# Patient Record
Sex: Male | Born: 1950 | Race: Black or African American | Hispanic: No | Marital: Married | Smoking: Former smoker
Health system: Southern US, Community
[De-identification: ages and names within clinical notes are randomized; demographics above are authoritative.]

## PROBLEM LIST (undated history)

## (undated) DIAGNOSIS — M549 Dorsalgia, unspecified: Secondary | ICD-10-CM

## (undated) DIAGNOSIS — H4940 Progressive external ophthalmoplegia, unspecified eye: Secondary | ICD-10-CM

## (undated) DIAGNOSIS — E7800 Pure hypercholesterolemia, unspecified: Secondary | ICD-10-CM

## (undated) DIAGNOSIS — I10 Essential (primary) hypertension: Secondary | ICD-10-CM

## (undated) DIAGNOSIS — M1990 Unspecified osteoarthritis, unspecified site: Secondary | ICD-10-CM

## (undated) DIAGNOSIS — H499 Unspecified paralytic strabismus: Secondary | ICD-10-CM

## (undated) DIAGNOSIS — F419 Anxiety disorder, unspecified: Secondary | ICD-10-CM

## (undated) DIAGNOSIS — H409 Unspecified glaucoma: Secondary | ICD-10-CM

## (undated) HISTORY — DX: Progressive external ophthalmoplegia, unspecified eye: H49.40

## (undated) HISTORY — DX: Dorsalgia, unspecified: M54.9

## (undated) HISTORY — DX: Unspecified paralytic strabismus: H49.9

## (undated) HISTORY — DX: Pure hypercholesterolemia, unspecified: E78.00

---

## 1982-09-25 HISTORY — PX: KNEE ARTHROSCOPY: SUR90

## 2001-12-30 ENCOUNTER — Ambulatory Visit (HOSPITAL_COMMUNITY): Admission: RE | Admit: 2001-12-30 | Discharge: 2001-12-30 | Payer: Self-pay | Admitting: Gastroenterology

## 2006-03-06 ENCOUNTER — Ambulatory Visit (HOSPITAL_COMMUNITY)
Admission: RE | Admit: 2006-03-06 | Discharge: 2006-03-06 | Payer: Self-pay | Admitting: Physical Medicine and Rehabilitation

## 2006-10-12 ENCOUNTER — Encounter: Admission: RE | Admit: 2006-10-12 | Discharge: 2006-10-12 | Payer: Self-pay | Admitting: Family Medicine

## 2006-10-14 ENCOUNTER — Encounter: Admission: RE | Admit: 2006-10-14 | Discharge: 2006-10-14 | Payer: Self-pay | Admitting: Family Medicine

## 2008-05-11 ENCOUNTER — Encounter: Admission: RE | Admit: 2008-05-11 | Discharge: 2008-05-11 | Payer: Self-pay | Admitting: Family Medicine

## 2010-10-16 ENCOUNTER — Encounter: Payer: Self-pay | Admitting: Family Medicine

## 2010-12-14 ENCOUNTER — Ambulatory Visit: Payer: Self-pay | Admitting: Sports Medicine

## 2011-02-10 NOTE — Procedures (Signed)
[facility]  Patient:    Todd [name], Todd [name] Visit Number: [id number] MRN: [id number]          Service Type: END Location: ENDO Attending Physician:  Dennison Bulla Ii Dictated by:   Verlin Grills, M.D. Proc. Date: 12/30/01 Admit Date:  12/30/2001   CC:         Desma Maxim, M.D.   Procedure Report  PROCEDURE:  Esophagogastroduodenoscopy and screening colonoscopy.  REFERRING PHYSICIAN:  Desma Maxim, M.D.  PROCEDURE INDICATION:  Mr. Todd [name] is a 60 year old male born _______ . For approximately a year, Mr. Todd [name] has had discomfort in his neck which does not interfere with his swallowing he does not experience odynophagia.  Mr. Todd [name] also has intermittent discomfort in his left upper quadrant of the abdomen. He denies dysphagia, odynophagia, gastrointestinal bleeding.  He was empirically placed on a proton pump inhibitor without improvement in his throat discomfort.  I discussed with Mr. Todd [name] the complications associated with Esophagogastroduodenoscopy and screening colonoscopy including intestinal bleeding and intestinal perforation. Mr. Todd [name] has signed the operative permit.  MEDICATION ALLERGIES:  None.  PAST MEDICAL HISTORY:  Glaucoma.  CHRONIC MEDICATIONS:  Timpoptic eye drops, Allegra.  PROCEDURE:  Esophagogastroduodenoscopy.  DESCRIPTION OF PROCEDURE:  After obtaining informed consent, Mr. Todd [name] was placed in the left lateral decubitus position. I administered intravenous Demerol and intravenous Versed to achieve conscious sedation for the procedure. The patients blood pressure and oxygen saturation and cardiac rhythm were monitored throughout the procedure and documented in the medical record. Todd [name] received 7.5 mg Versed and 50 mg Demerol for his endoscopic procedures. The Olympus gastroscope was passed through the posterior hypopharynx into the proximal esophagus  without difficulty. The hypopharynx, larynx, and vocal cords appeared normal.  Esophagoscopy:  The proximal, mid, and lower segments of the esophagus appeared completely normal.  Gastroscopy:  During the procedure, the up-down control on the gastroscope snapped. I was unable to get an adequate retroflex view of the gastric cardia and fundus. The gastric body, antrum, and pylorus appeared normal.  Duodenoscopy:  The duodenal bulb and descending duodenum appeared normal.  ASSESSMENT:  Normal esophagogastroduodenoscopy. I find no explanation to explain Todd [name] neck pain. I suspect an ear, nose, and throat evaluation is the next step.  PROCEDURE:  Screening colonoscopy.  DESCRIPTION OF PROCEDURE:  Anal inspection was normal. Digital rectal exam was normal. The Olympus Pediatric video colonoscope was introduced into the rectum and easily advanced to the cecum. Colonic preparation for the exam today was excellent.  Rectum:  Normal.  Sigmoid colon and descending colon:  Normal.  Splenic flexure:  Normal.  Transverse colon:  Normal.  Hepatic flexure:  Normal.  Ascending colon:  Normal.  Cecum and ileocecal valve:  Normal.  ASSESSMENT:  Normal screening proctocolonoscopy to the cecum. No endoscopic evidence for the presence of colorectal neoplasia or inflammatory bowel disease. There is no colonic disease to explain his intermittent left upper quadrant abdominal discomfort except for possibly intestinal spasm. If he has not had a CT scan of this area to rule out a retroperitoneal process, I would recommend doing so. Dictated by:   Verlin Grills, M.D. Attending Physician:  Dennison Bulla Ii DD:  12/30/01 TD:  12/30/01 Job: [id number]

## 2014-10-13 ENCOUNTER — Other Ambulatory Visit: Payer: Self-pay | Admitting: Family Medicine

## 2014-10-13 DIAGNOSIS — R29898 Other symptoms and signs involving the musculoskeletal system: Secondary | ICD-10-CM

## 2014-10-13 DIAGNOSIS — M5432 Sciatica, left side: Secondary | ICD-10-CM

## 2014-10-14 ENCOUNTER — Ambulatory Visit
Admission: RE | Admit: 2014-10-14 | Discharge: 2014-10-14 | Disposition: A | Discharge status: Home / Self Care | Payer: BLUE CROSS/BLUE SHIELD | Source: Ambulatory Visit | Attending: Family Medicine | Admitting: Family Medicine

## 2014-10-14 DIAGNOSIS — M5432 Sciatica, left side: Secondary | ICD-10-CM

## 2014-10-14 DIAGNOSIS — R29898 Other symptoms and signs involving the musculoskeletal system: Secondary | ICD-10-CM

## 2014-12-29 ENCOUNTER — Emergency Department (HOSPITAL_COMMUNITY)
Admission: EM | Admit: 2014-12-29 | Discharge: 2014-12-29 | Disposition: A | Discharge status: Home / Self Care | Payer: BLUE CROSS/BLUE SHIELD | Attending: Emergency Medicine | Admitting: Emergency Medicine

## 2014-12-29 ENCOUNTER — Encounter (HOSPITAL_COMMUNITY): Payer: Self-pay

## 2014-12-29 ENCOUNTER — Emergency Department (HOSPITAL_COMMUNITY): Payer: BLUE CROSS/BLUE SHIELD

## 2014-12-29 DIAGNOSIS — M1990 Unspecified osteoarthritis, unspecified site: Secondary | ICD-10-CM | POA: Diagnosis not present

## 2014-12-29 DIAGNOSIS — Z7982 Long term (current) use of aspirin: Secondary | ICD-10-CM | POA: Insufficient documentation

## 2014-12-29 DIAGNOSIS — H532 Diplopia: Secondary | ICD-10-CM | POA: Diagnosis present

## 2014-12-29 DIAGNOSIS — E780 Pure hypercholesterolemia: Secondary | ICD-10-CM | POA: Diagnosis not present

## 2014-12-29 DIAGNOSIS — H409 Unspecified glaucoma: Secondary | ICD-10-CM | POA: Diagnosis not present

## 2014-12-29 DIAGNOSIS — G4489 Other headache syndrome: Secondary | ICD-10-CM

## 2014-12-29 DIAGNOSIS — Z87891 Personal history of nicotine dependence: Secondary | ICD-10-CM | POA: Insufficient documentation

## 2014-12-29 DIAGNOSIS — F419 Anxiety disorder, unspecified: Secondary | ICD-10-CM | POA: Insufficient documentation

## 2014-12-29 DIAGNOSIS — J209 Acute bronchitis, unspecified: Secondary | ICD-10-CM | POA: Diagnosis not present

## 2014-12-29 DIAGNOSIS — I10 Essential (primary) hypertension: Secondary | ICD-10-CM | POA: Insufficient documentation

## 2014-12-29 DIAGNOSIS — Z79899 Other long term (current) drug therapy: Secondary | ICD-10-CM | POA: Diagnosis not present

## 2014-12-29 HISTORY — DX: Essential (primary) hypertension: I10

## 2014-12-29 HISTORY — DX: Anxiety disorder, unspecified: F41.9

## 2014-12-29 HISTORY — DX: Pure hypercholesterolemia, unspecified: E78.00

## 2014-12-29 HISTORY — DX: Unspecified osteoarthritis, unspecified site: M19.90

## 2014-12-29 HISTORY — DX: Unspecified glaucoma: H40.9

## 2014-12-29 LAB — COMPREHENSIVE METABOLIC PANEL
ALBUMIN: 3.6 g/dL (ref 3.5–5.2)
ALT: 22 U/L (ref 0–53)
AST: 23 U/L (ref 0–37)
Alkaline Phosphatase: 38 U/L — ABNORMAL LOW (ref 39–117)
Anion gap: 6 (ref 5–15)
BUN: 8 mg/dL (ref 6–23)
CALCIUM: 9.1 mg/dL (ref 8.4–10.5)
CHLORIDE: 106 mmol/L (ref 96–112)
CO2: 26 mmol/L (ref 19–32)
CREATININE: 1.06 mg/dL (ref 0.50–1.35)
GFR calc Af Amer: 84 mL/min — ABNORMAL LOW (ref >90)
GFR, EST NON AFRICAN AMERICAN: 72 mL/min — AB (ref >90)
Glucose, Bld: 102 mg/dL — ABNORMAL HIGH (ref 70–99)
Potassium: 3.9 mmol/L (ref 3.5–5.1)
SODIUM: 138 mmol/L (ref 135–145)
Total Bilirubin: 0.9 mg/dL (ref 0.3–1.2)
Total Protein: 6.6 g/dL (ref 6.0–8.3)

## 2014-12-29 LAB — CBC WITH DIFFERENTIAL/PLATELET
Basophils Absolute: 0.1 10*3/uL (ref 0.0–0.1)
Basophils Relative: 1 % (ref 0–1)
EOS ABS: 0.9 10*3/uL — AB (ref 0.0–0.7)
EOS PCT: 15 % — AB (ref 0–5)
HCT: 41.6 % (ref 39.0–52.0)
HEMOGLOBIN: 13.9 g/dL (ref 13.0–17.0)
LYMPHS ABS: 1.8 10*3/uL (ref 0.7–4.0)
Lymphocytes Relative: 29 % (ref 12–46)
MCH: 32 pg (ref 26.0–34.0)
MCHC: 33.4 g/dL (ref 30.0–36.0)
MCV: 95.6 fL (ref 78.0–100.0)
MONOS PCT: 7 % (ref 3–12)
Monocytes Absolute: 0.4 10*3/uL (ref 0.1–1.0)
Neutro Abs: 3 10*3/uL (ref 1.7–7.7)
Neutrophils Relative %: 48 % (ref 43–77)
PLATELETS: 202 10*3/uL (ref 150–400)
RBC: 4.35 MIL/uL (ref 4.22–5.81)
RDW: 12.7 % (ref 11.5–15.5)
WBC: 6.3 10*3/uL (ref 4.0–10.5)

## 2014-12-29 LAB — TSH: TSH: 0.347 u[IU]/mL — AB (ref 0.350–4.500)

## 2014-12-29 LAB — SEDIMENTATION RATE: Sed Rate: 23 mm/hr — ABNORMAL HIGH (ref 0–16)

## 2014-12-29 MED ORDER — BENZONATATE 100 MG PO CAPS: 100.0000 mg | ORAL_CAPSULE | Freq: Three times a day (TID) | ORAL | Status: DC

## 2014-12-29 MED ORDER — GADOBENATE DIMEGLUMINE 529 MG/ML IV SOLN
20.0000 mL | Freq: Once | INTRAVENOUS | Status: AC
  Administered 2014-12-29: 19 mL via INTRAVENOUS

## 2014-12-29 MED ORDER — PREDNISONE 20 MG PO TABS: ORAL_TABLET | ORAL | Status: DC

## 2014-12-29 MED ORDER — HYDROCODONE-HOMATROPINE 5-1.5 MG/5ML PO SYRP: 5.0000 mL | ORAL_SOLUTION | Freq: Four times a day (QID) | ORAL | Status: DC | PRN

## 2014-12-29 NOTE — ED Notes (Signed)
Pt given eye patch per MD request

## 2014-12-29 NOTE — ED Notes (Signed)
Pt comfortable with discharge and follow up instructions. Prescriptions x3.

## 2014-12-29 NOTE — ED Notes (Signed)
MD at bedside. 

## 2014-12-29 NOTE — ED Notes (Signed)
Onset 1-2 months headache, takes Tylenol or Ibuprofen with relief.  Onset 2 weeks double vision in right eye, looks to left and double vision goes away.  No other symptoms noted.

## 2014-12-29 NOTE — ED Provider Notes (Signed)
CSN: [id number]     Arrival date & time 12/29/14  1058 History   First MD Initiated Contact with Patient 12/29/14 1232     Chief Complaint  Patient presents with  . Headache  . Diplopia     (Consider location/radiation/quality/duration/timing/severity/associated sxs/prior Treatment) HPI The patient reports that he developed a headache about 3-4 weeks ago. It is predominantly on the left side of his head. It has an aching quality. He reports that as of about 2 weeks ago he started to develop double vision. He has not had any fevers, chills or neck stiffness. There's been no nausea or vomiting. There has been no weakness numbness or tingling of the extremities. There is been no gait incoordination. The double vision is made worse by looking to the right. It changes as he moves his eyes. The patient was seen at the ophthalmologist's office today and referred to the emergency department for further evaluation. Reviewing their note, there were concerns for temporal arteritis in suggestion for neuro imaging and neurology consultation. Past Medical History  Diagnosis Date  . Hypertension   . Arthritis   . Anxiety   . Elevated cholesterol   . Glaucoma     right worse than left   Past Surgical History  Procedure Laterality Date  . Knee arthroscopy Left    History reviewed. No pertinent family history. History  Substance Use Topics  . Smoking status: Former Research scientist (life sciences)  . Smokeless tobacco: Not on file  . Alcohol Use: Yes     Comment: once month    Review of Systems  10 Systems reviewed and are negative for acute change except as noted in the HPI. Patient notes that he has had some minor cold symptoms with cough. He denies she's had any chest pain, sputum production or fever. He states he had an appointment with his primary care doctor today to address this, he however had to cancel that due to being sent to the emergency department for further evaluation of his headache and ocular  dysfunction.  Allergies  Alphagan p  Home Medications   Prior to Admission medications   Medication Sig Start Date End Date Taking? Authorizing Provider  aspirin 81 MG tablet Take 81 mg by mouth daily.   Yes Historical Provider, MD  atenolol (TENORMIN) 25 MG tablet Take 25 mg by mouth daily.   Yes Historical Provider, MD  bimatoprost (LUMIGAN) 0.03 % ophthalmic solution Place 1 drop into both eyes at bedtime.   Yes Historical Provider, MD  co-enzyme Q-10 30 MG capsule Take 30 mg by mouth daily as needed (with joint pain).   Yes Historical Provider, MD  ezetimibe (ZETIA) 10 MG tablet Take 10 mg by mouth every evening.   Yes Historical Provider, MD  ibuprofen (ADVIL,MOTRIN) 200 MG tablet Take 400 mg by mouth every 6 (six) hours as needed for headache.   Yes Historical Provider, MD  Multiple Vitamin (MULTIVITAMIN WITH MINERALS) TABS tablet Take 1 tablet by mouth daily.   Yes Historical Provider, MD  Omega-3 Fatty Acids (FISH OIL) 1200 MG CAPS Take 1 capsule by mouth daily.   Yes Historical Provider, MD  benzonatate (TESSALON) 100 MG capsule Take 1 capsule (100 mg total) by mouth every 8 (eight) hours. 12/29/14   Charlesetta Shanks, MD  dorzolamide-timolol (COSOPT) 22.3-6.8 MG/ML ophthalmic solution Place 1 drop into both eyes 2 (two) times daily.    Historical Provider, MD  HYDROcodone-homatropine (HYCODAN) 5-1.5 MG/5ML syrup Take 5 mLs by mouth every 6 (six) hours as  needed for cough. 12/29/14   Charlesetta Shanks, MD  predniSONE (DELTASONE) 20 MG tablet 3 tabs po day one, then 2 tabs daily x 4 days 12/29/14   Charlesetta Shanks, MD   BP 149/76 mmHg  Pulse 58  Temp(Src) 97.8 F (36.6 C) (Oral)  Resp 16  Ht 5\' 11"  (1.803 m)  Wt 190 lb 14.4 oz (86.592 kg)  BMI 26.64 kg/m2  SpO2 98% Physical Exam  Constitutional: He is oriented to person, place, and time. He appears well-developed and well-nourished.  HENT:  Head: Normocephalic and atraumatic.  Eyes: Pupils are equal, round, and reactive to light.   Patient has asymmetric gaze with lack of lateral movement of the left eye for leftward gaze.  Neck: Neck supple.  Cardiovascular: Normal rate, regular rhythm, normal heart sounds and intact distal pulses.   Pulmonary/Chest: Effort normal and breath sounds normal.  Abdominal: Soft. Bowel sounds are normal. He exhibits no distension. There is no tenderness.  Musculoskeletal: Normal range of motion. He exhibits no edema.  Neurological: He is alert and oriented to person, place, and time. He has normal strength. A cranial nerve deficit is present. He exhibits normal muscle tone. Coordination normal. GCS eye subscore is 4. GCS verbal subscore is 5. GCS motor subscore is 6.  Facial cranial nerves are otherwise intact. Except I motions as noted. Normal finger-nose examination bilaterally normal heel shin examination.  Skin: Skin is warm, dry and intact.  Psychiatric: He has a normal mood and affect.    ED Course  Procedures (including critical care time) Labs Review Labs Reviewed  COMPREHENSIVE METABOLIC PANEL - Abnormal; Notable for the following:    Glucose, Bld 102 (*)    Alkaline Phosphatase 38 (*)    GFR calc non Af Amer 72 (*)    GFR calc Af Amer 84 (*)    All other components within normal limits  CBC WITH DIFFERENTIAL/PLATELET - Abnormal; Notable for the following:    Eosinophils Relative 15 (*)    Eosinophils Absolute 0.9 (*)    All other components within normal limits  SEDIMENTATION RATE - Abnormal; Notable for the following:    Sed Rate 23 (*)    All other components within normal limits  TSH - Abnormal; Notable for the following:    TSH 0.347 (*)    All other components within normal limits  C-REACTIVE PROTEIN  T4, FREE    Imaging Review Mr Kizzie Fantasia Contrast  12/29/2014   CLINICAL DATA:  Headache. Intermittent diplopia over the last 22 months.  EXAM: MRI HEAD AND ORBITS WITHOUT AND WITH CONTRAST  TECHNIQUE: Multiplanar, multiecho pulse sequences of the brain and  surrounding structures were obtained without and with intravenous contrast. Multiplanar, multiecho pulse sequences of the orbits and surrounding structures were obtained including fat saturation techniques, before and after intravenous contrast administration.  CONTRAST:  11mL MULTIHANCE GADOBENATE DIMEGLUMINE 529 MG/ML IV SOLN  COMPARISON:  None.  FINDINGS: MRI HEAD FINDINGS  Mild periventricular and subcortical T2 changes are slightly greater than expected for age. No acute infarct, hemorrhage, or mass lesion is present. Insert pass ventricles insert pass fluid  Flow is present in the major intracranial arteries. Mild mucosal thickening is present in the right maxillary sinus and anterior right ethmoid air cells. Mild mucosal thickening is present along the inferior aspect of the right frontal sinus. The remaining paranasal sinuses and the mastoid air cells are clear. The skullbase is within normal limits. Midline structures are normal.  MRI ORBITS FINDINGS  There is mild atrophy of the right optic nerve. No nerve signal abnormality or enhancement is evident. The optic chiasm is within normal limits. The left optic nerve is normal.  The orbital musculature is within normal is by laterally. The lacrimal gland is normal. The globes are unremarkable. The lens is located bilaterally.  IMPRESSION: 1. 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 2. No other focal lesion of the orbits to explain diplopia. 3. Mild periventricular and subcortical T2 changes are slightly advanced for age. The finding is nonspecific but can be seen in the setting of chronic microvascular ischemia, a demyelinating process such as multiple sclerosis, vasculitis, complicated migraine headaches, or as the sequelae of a prior infectious or inflammatory process. 4. No focal pathology within the occipital cortex.   Electronically Signed   By: San Morelle M.D.   On: 12/29/2014 15:45   Mr  Darnelle Catalan Wo/w Cm  12/29/2014   CLINICAL DATA:  Headache. Intermittent diplopia over the last 22 months.  EXAM: MRI HEAD AND ORBITS WITHOUT AND WITH CONTRAST  TECHNIQUE: Multiplanar, multiecho pulse sequences of the brain and surrounding structures were obtained without and with intravenous contrast. Multiplanar, multiecho pulse sequences of the orbits and surrounding structures were obtained including fat saturation techniques, before and after intravenous contrast administration.  CONTRAST:  50mL MULTIHANCE GADOBENATE DIMEGLUMINE 529 MG/ML IV SOLN  COMPARISON:  None.  FINDINGS: MRI HEAD FINDINGS  Mild periventricular and subcortical T2 changes are slightly greater than expected for age. No acute infarct, hemorrhage, or mass lesion is present. Insert pass ventricles insert pass fluid  Flow is present in the major intracranial arteries. Mild mucosal thickening is present in the right maxillary sinus and anterior right ethmoid air cells. Mild mucosal thickening is present along the inferior aspect of the right frontal sinus. The remaining paranasal sinuses and the mastoid air cells are clear. The skullbase is within normal limits. Midline structures are normal.  MRI ORBITS FINDINGS  There is mild atrophy of the right optic nerve. No nerve signal abnormality or enhancement is evident. The optic chiasm is within normal limits. The left optic nerve is normal.  The orbital musculature is within normal is by laterally. The lacrimal gland is normal. The globes are unremarkable. The lens is located bilaterally.  IMPRESSION: 1. 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 2. No other focal lesion of the orbits to explain diplopia. 3. Mild periventricular and subcortical T2 changes are slightly advanced for age. The finding is nonspecific but can be seen in the setting of chronic microvascular ischemia, a demyelinating process such as multiple sclerosis, vasculitis, complicated  migraine headaches, or as the sequelae of a prior infectious or inflammatory process. 4. No focal pathology within the occipital cortex.   Electronically Signed   By: San Morelle M.D.   On: 12/29/2014 15:45     EKG Interpretation None     Consultation: Case was reviewed with Dr. Leonel Ramsay. He has reviewed the MR results and diagnostic studies. We have discussed the patient's physical examination history. 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 Based on current evaluation the patient is safe for discharge with outpatient neurology follow-up. MDM   Final diagnoses:  Diplopia  Other headache syndrome  Acute bronchitis, unspecified organism   The patient has been evaluated as outlined. At this point based on MR findings and neurology consultation patient be  discharged for continued outpatient follow-up. He has been seen by ophthalmology today. He has continued treatment scheduled with ophthalmology as well. The patient incidentally missed a appointment with his family care provider for cough symptoms. At this point does seem most consistent with a minor acute bronchitis. The patient is well in appearance with no wheezing or dyspnea or chest pain associated. He requested treatment for bronchitis as he had missed that appointment today. Based on this is been treated for acute bronchitis with cough medications.    Charlesetta Shanks, MD 12/29/14 [phone]

## 2014-12-29 NOTE — Discharge Instructions (Signed)
Diplopia  °Double vision (diplopia) means that you are seeing two of everything. Diplopia usually occurs with both eyes open, and may be worse when looking in one particular direction. If both eyes must be open to see double, this is called binocular diplopia. If double images are seen in just one eye, this is called monocular diplopia.  °CAUSES  °Binocular Diplopia °· Disorder affecting the muscles that move the eyes or the nerves that control those muscles. °· Tumor or other mass pushing on an eye from beside or behind the eye. °· Myasthenia gravis. This is a neuromuscular illness that causes the body's muscles to tire easily. The eye muscles and eyelid muscles become weak. The eyes do not track together well. °· Grave's disease. This is an overactivity of the thyroid gland. This condition causes swelling of tissues around the eyes. This produces a bulging out of the eyeball. °· Blowout fracture of the bone around the eye. The muscles of the eye socket are damaged. This often happens when the eye is hit with force. °· Complications after certain surgeries, such as a lens implant after cataract surgery. °· Fluid-filled mass (abscess) behind or beside the eye, infection, or abnormal connection between arteries and veins. °Sometimes, no cause is found. °Monocular Diplopia °· Problems with corrective lens or contacts. °· Corneal abrasion, infection, severe injury, or bulging and irregularity of the corneal surface (keratoconus). °· Irregularities of the pupil from drugs, severe injury, or other causes. °· Problems involving the lens of the eye, such as opacities or cataracts. °· Complications after certain surgeries, such as a lens implant after cataract surgery. °· Retinal detachment or problems involving the blood vessels of the retina. °Sometimes, no cause is found. °SYMPTOMS  °Binocular Diplopia °· When one eye is closed or covered, the double images disappear. °Monocular Diplopia °· When the unaffected eye is  closed or covered, the double images remain. The double images disappear when the affected eye is closed or covered. °DIAGNOSIS  °A diagnosis is made during an eye exam. °TREATMENT  °Treatment depends on the cause or underlying disease.  °· Relief of double vision symptoms may be achieved by patching one eye or by using special glasses. °· Surgery on the muscles of the eye may be needed. °SEEK IMMEDIATE MEDICAL CARE IF:  °· You see two images of a single object you are looking at, either with both eyes open or with just one eye open. °Document Released: 07/13/2004 Document Revised: 12/04/2011 Document Reviewed: 04/29/2008 °[employer]® Patient Information ©2015 [employer]. This information is not intended to replace advice given to you by your health care provider. Make sure you discuss any questions you have with your health care provider. ° °

## 2014-12-30 LAB — C-REACTIVE PROTEIN: CRP: <0.5 mg/dL — ABNORMAL LOW (ref <0.60)

## 2014-12-30 LAB — T4, FREE: FREE T4: 1.08 ng/dL (ref 0.80–1.80)

## 2015-01-04 ENCOUNTER — Ambulatory Visit (INDEPENDENT_AMBULATORY_CARE_PROVIDER_SITE_OTHER): Payer: BLUE CROSS/BLUE SHIELD | Admitting: Neurology

## 2015-01-04 ENCOUNTER — Encounter: Payer: Self-pay | Admitting: Neurology

## 2015-01-04 VITALS — BP 128/83 | HR 81 | Temp 97.9°F | Ht 71.0 in | Wt 194.5 lb

## 2015-01-04 DIAGNOSIS — G4459 Other complicated headache syndrome: Secondary | ICD-10-CM

## 2015-01-04 DIAGNOSIS — I776 Arteritis, unspecified: Secondary | ICD-10-CM | POA: Diagnosis not present

## 2015-01-04 DIAGNOSIS — H4942 Progressive external ophthalmoplegia, left eye: Secondary | ICD-10-CM | POA: Diagnosis not present

## 2015-01-04 DIAGNOSIS — G08 Intracranial and intraspinal phlebitis and thrombophlebitis: Secondary | ICD-10-CM

## 2015-01-04 DIAGNOSIS — H532 Diplopia: Secondary | ICD-10-CM

## 2015-01-04 DIAGNOSIS — H4922 Sixth [abducent] nerve palsy, left eye: Secondary | ICD-10-CM

## 2015-01-04 DIAGNOSIS — H4720 Unspecified optic atrophy: Secondary | ICD-10-CM | POA: Diagnosis not present

## 2015-01-04 NOTE — Patient Instructions (Signed)
Overall you are doing fairly well but I do want to suggest a few things today:   Remember to drink plenty of fluid, eat healthy meals and do not skip any meals. Try to eat protein with a every meal and eat a healthy snack such as fruit or nuts in between meals. Try to keep a regular sleep-wake schedule and try to exercise daily, particularly in the form of walking, 20-30 minutes a day, if you can.   As far as your medications are concerned, I would like to suggest: hold off  As far as diagnostic testing: imaging, lumbar puncture, labwork  I would like to see you back in 1 month, sooner if we need to. Please call us with any interim questions, concerns, problems, updates or refill requests.   Please also call us for any test results so we can go over those with you on the phone.  My clinical assistant and will answer any of your questions and relay your messages to me and also relay most of my messages to you.   Our phone number is [phone]. We also have an after hours call service for urgent matters and there is a physician on-call for urgent questions. For any emergencies you know to call 911 or go to the nearest emergency room

## 2015-01-04 NOTE — Progress Notes (Addendum)
[facility]    Provider:  Dr Jaynee Eagles Referring Provider: No ref. provider found Primary Care Physician:  Mayra Neer, MD  CC:  diplopia  HPI:  Todd [name] is a 64 y.o. male here as a referral from the hospital for diplopia. He is having pain on the left side of the head. Pain on eye movement. Headache, cramping to the middle of the head. No light sensitivity, no sound sensitivity, no nausea, no vomiting. Double vision in the left eye for 2 weeks, continuously all day. 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 Has had vision problems for years due to the glaucoma. But the double vision only for the last 2 weeks. Only when both eyes are open. Images Side by side. Worse when looking far away. Worse when looks to the left . It is getting worse. Headache is getting worse. Headache for 2 months. Headache worse during the day. Better at night. Ibuprofen helps. Is severe.  Left eye movement is painful, severe pain. No inciting factors.   Reviewed notes, labs and imaging from outside physicians, which showed:  MR brain and Orbits: (personally reviewed images)  IMPRESSION: 1. 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 2. No other focal lesion of the orbits to explain diplopia. 3. Mild periventricular and subcortical T2 changes are slightly advanced for age. The finding is nonspecific but can be seen in the setting of chronic microvascular ischemia, a demyelinating process such as multiple sclerosis, vasculitis, complicated migraine headaches, or as the sequelae of a prior infectious or inflammatory process. 4. No focal pathology within the occipital cortex.  Review of Systems: Patient complains of symptoms per HPI as well as the following symptoms double vision, blurry vision, glaucome, No CP, no SOB. Pertinent negatives per HPI. All others negative.   History   Social History  . Marital Status: Married    Spouse  Name: N/A  . Number of Children: 4  . Years of Education: Bachelor's   Occupational History  . [place] History Main Topics  . Smoking status: Former Smoker -- 10 years    Types: Cigarettes    Quit date: 09/25/1976  . Smokeless tobacco: Not on file  . Alcohol Use: 0.0 oz/week    0 Standard drinks or equivalent per week     Comment: Once per month  . Drug Use: No  . Sexual Activity: Not on file   Other Topics Concern  . Not on file   Social History Narrative   Lives at home with wife.   Right handed.   Caffeine use: 1 cup coffee per day.   Occasionally drinks soda and/or tea     Family History  Problem Relation Age of Onset  . Glaucoma Mother   . Glaucoma Father   . Pancreatic cancer Mother     Past Medical History  Diagnosis Date  . Hypertension   . Arthritis   . Anxiety   . Elevated cholesterol   . Glaucoma     right worse than left  . High cholesterol   . Back pain     Fragments in vertebrae     Past Surgical History  Procedure Laterality Date  . Knee arthroscopy Left 1984    Current Outpatient Prescriptions  Medication Sig Dispense Refill  . aspirin 81 MG tablet Take 81 mg by mouth daily.    Marland Kitchen atenolol (TENORMIN) 25 MG tablet Take 25 mg by mouth daily.    Marland Kitchen  benzonatate (TESSALON) 100 MG capsule Take 1 capsule (100 mg total) by mouth every 8 (eight) hours. 21 capsule 0  . bimatoprost (LUMIGAN) 0.03 % ophthalmic solution Place 1 drop into both eyes at bedtime.    Marland Kitchen co-enzyme Q-10 30 MG capsule Take 30 mg by mouth daily as needed (with joint pain).    . dorzolamide-timolol (COSOPT) 22.3-6.8 MG/ML ophthalmic solution Place 1 drop into both eyes 2 (two) times daily.    Marland Kitchen ezetimibe (ZETIA) 10 MG tablet Take 10 mg by mouth every evening.    Marland Kitchen HYDROcodone-acetaminophen (NORCO/VICODIN) 5-325 MG per tablet Take 1 tablet by mouth as needed.  0  . HYDROcodone-homatropine (HYCODAN) 5-1.5 MG/5ML syrup Take 5 mLs by mouth every 6 (six) hours as  needed for cough. 120 mL 0  . ibuprofen (ADVIL,MOTRIN) 200 MG tablet Take 400 mg by mouth every 6 (six) hours as needed for headache.    . Multiple Vitamin (MULTIVITAMIN WITH MINERALS) TABS tablet Take 1 tablet by mouth daily.    . Omega-3 Fatty Acids (FISH OIL) 1200 MG CAPS Take 1 capsule by mouth daily.    . predniSONE (DELTASONE) 20 MG tablet 3 tabs po day one, then 2 tabs daily x 4 days (Patient not taking: Reported on 01/04/2015) 11 tablet 0   No current facility-administered medications for this visit.    Allergies as of 01/04/2015 - Review Complete 01/04/2015  Allergen Reaction Noted  . Alphagan p [brimonidine tartrate] Other (See Comments) 12/29/2014  . Lipitor [atorvastatin]  01/04/2015    Vitals: BP 128/83 mmHg  Pulse 81  Temp(Src) 97.9 F (36.6 C)  Ht 5\' 11"  (1.803 m)  Wt 194 lb 8 oz (88.225 kg)  BMI 27.14 kg/m2 Last Weight:  Wt Readings from Last 1 Encounters:  01/04/15 194 lb 8 oz (88.225 kg)   Last Height:   Ht Readings from Last 1 Encounters:  01/04/15 5\' 11"  (1.803 m)   Physical exam: Exam: Gen: NAD, conversant, well nourised              CV: RRR, no MRG. No Carotid Bruits. No peripheral edema, warm, nontender Eyes: Conjunctival injection  Neuro: Detailed Neurologic Exam  Speech:    Speech is normal; fluent and spontaneous with normal comprehension.  Cognition:    The patient is oriented to person, place, and time;     recent and remote memory intact;     language fluent;     normal attention, concentration,     fund of knowledge Cranial Nerves:    Right optic nerve pallor and atrophy, APD. Inability to abduct left eye past the midline. 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 Trigeminal sensation is intact and the muscles of mastication are normal. The face is symmetric. The palate elevates in the midline. Hearing intact. Voice is normal. Shoulder shrug is normal. The tongue has normal motion without fasciculations.    Coordination:    Normal finger to nose and heel to shin.   Gait:    Heel-toe and tandem gait are normal.   Motor Observation:    No asymmetry, no atrophy, and no involuntary movements noted. Tone:    Normal muscle tone.    Posture:    Posture is normal. normal erect    Strength:    Strength is V/V in the upper and lower limbs.      Sensation: intact to LT     Reflex Exam:  DTR's:    Deep tendon reflexes  in the upper and lower extremities are symmetric bilaterally.   Toes:    The toes are downgoing bilaterally.   Clonus:    Clonus is absent.     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 Only the 6th nerve appears to be involved but given the pain component, need to rule out intracavernous carotid artery aneurysm, posterior cerebral artery aneurysm, carotid cavernous fistula or carotid cavernous sinus thrombosis or other aneurysm or dissection, thrombosis of the cerebral veins and sinuses. Could be microvascular ischemic ocular motor cranial nerve palsy which is accompanied by pain. No recent trauma.    Will check MRV of the head. Can look at the cerebral veins and sinuses.  Will check labs.    Spoke to Dr. Darleen Crocker Ophthalmologist at [facility], right optic atrophy is chronic secondary to glaucoma  Could look at MRA, suppose could have an Intracavernous internal carotid artery aneurysm. Will hold off.  Sarina Ill, MD  [facility] [address] [place], [place] [address]  Phone [phone] Fax [phone]

## 2015-01-07 ENCOUNTER — Other Ambulatory Visit (INDEPENDENT_AMBULATORY_CARE_PROVIDER_SITE_OTHER): Payer: Self-pay

## 2015-01-07 DIAGNOSIS — H4720 Unspecified optic atrophy: Secondary | ICD-10-CM

## 2015-01-07 DIAGNOSIS — H532 Diplopia: Secondary | ICD-10-CM

## 2015-01-07 DIAGNOSIS — Z0289 Encounter for other administrative examinations: Secondary | ICD-10-CM

## 2015-01-07 DIAGNOSIS — H4942 Progressive external ophthalmoplegia, left eye: Secondary | ICD-10-CM

## 2015-01-07 DIAGNOSIS — I776 Arteritis, unspecified: Secondary | ICD-10-CM

## 2015-01-11 ENCOUNTER — Telehealth: Payer: Self-pay | Admitting: Neurology

## 2015-01-11 DIAGNOSIS — H4942 Progressive external ophthalmoplegia, left eye: Secondary | ICD-10-CM | POA: Insufficient documentation

## 2015-01-11 DIAGNOSIS — G08 Intracranial and intraspinal phlebitis and thrombophlebitis: Secondary | ICD-10-CM | POA: Insufficient documentation

## 2015-01-11 NOTE — Telephone Encounter (Signed)
Spoke to patient. Will try and get an MRA of his head and MRV of his head within th enext 2 days due to painful left ophthalmoplegia.

## 2015-01-11 NOTE — Telephone Encounter (Signed)
Spoke to patient. Eye not improving. Left eye is in a lot pain. Left temple is in pain. Labs are coming back normal. He has MRA of the head scheduled. Will order LP as well.

## 2015-01-12 ENCOUNTER — Ambulatory Visit
Admission: RE | Admit: 2015-01-12 | Discharge: 2015-01-12 | Disposition: A | Discharge status: Home / Self Care | Payer: BLUE CROSS/BLUE SHIELD | Source: Ambulatory Visit | Attending: Neurology | Admitting: Neurology

## 2015-01-12 DIAGNOSIS — H532 Diplopia: Secondary | ICD-10-CM

## 2015-01-12 DIAGNOSIS — G08 Intracranial and intraspinal phlebitis and thrombophlebitis: Secondary | ICD-10-CM

## 2015-01-12 DIAGNOSIS — H4920 Sixth [abducent] nerve palsy, unspecified eye: Secondary | ICD-10-CM | POA: Insufficient documentation

## 2015-01-12 DIAGNOSIS — R519 Headache, unspecified: Secondary | ICD-10-CM | POA: Insufficient documentation

## 2015-01-12 DIAGNOSIS — R51 Headache: Secondary | ICD-10-CM | POA: Diagnosis not present

## 2015-01-12 DIAGNOSIS — H4942 Progressive external ophthalmoplegia, left eye: Secondary | ICD-10-CM

## 2015-01-12 DIAGNOSIS — H4720 Unspecified optic atrophy: Secondary | ICD-10-CM | POA: Insufficient documentation

## 2015-01-13 ENCOUNTER — Other Ambulatory Visit: Payer: Self-pay | Admitting: Neurology

## 2015-01-13 MED ORDER — PREDNISONE 20 MG PO TABS: ORAL_TABLET | ORAL | Status: DC

## 2015-01-13 NOTE — Telephone Encounter (Signed)
Left message for patient to call back. Gave [facility] phone number.

## 2015-01-13 NOTE — Telephone Encounter (Signed)
Patient has a painful 6th nerve palsy. 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   Will start empiric oral prednisone 80mg  daily for three days then taper by 20mg  every 2 weeks. Will need to watch for side effects closely including glucose, blood pressure, mood.

## 2015-01-13 NOTE — Telephone Encounter (Signed)
Talked with patient about normal MRV results and patient would like to talk with Dr. Jaynee Eagles about next steps. I told him he has a follow up appt with Dr. Jaynee Eagles on May 11 at 2:30pm. He verbalized understanding and would like Dr. Jaynee Eagles to call him back.

## 2015-01-19 ENCOUNTER — Telehealth: Payer: Self-pay | Admitting: Neurology

## 2015-01-19 NOTE — Telephone Encounter (Signed)
Talked with patient and he is going to keep taking Prednisone (20mg ) 3 times per day per Dr. Jaynee Eagles instructions. He also has an appointment on Feb 03, 2015 at 2:30  pm. I told him to arrive at 2:15pm for his appt. Pt verbalized understanding.

## 2015-01-19 NOTE — Telephone Encounter (Signed)
Pt is calling stating he is still taking predniSONE (DELTASONE) 20 MG tablet and his headache is gone, but still having double vision.  Please call and advise.

## 2015-01-19 NOTE — Telephone Encounter (Signed)
Emma, That's good, please instruct him to keep taking it. Make sure he has a follow up with me within 2 weeks, 30 minutes. Make sure he is on the right dose, look at my script. thanks Thank you.

## 2015-01-19 NOTE — Telephone Encounter (Signed)
Thank you :)

## 2015-01-20 ENCOUNTER — Other Ambulatory Visit: Payer: BLUE CROSS/BLUE SHIELD

## 2015-01-22 LAB — CK: Total CK: 248 U/L — ABNORMAL HIGH (ref 24–204)

## 2015-01-22 LAB — ACETYLCHOLINE RECEPTOR, BINDING: AChR Binding Ab, Serum: 0.08 nmol/L (ref 0.00–0.24)

## 2015-01-22 LAB — NEUROMYELITIS OPTICA AUTOAB, IGG: NMO-IgG: <1.5 U/mL (ref 0.0–3.0)

## 2015-01-22 LAB — MULTIPLE MYELOMA PANEL, SERUM
ALBUMIN SERPL ELPH-MCNC: 4.1 g/dL (ref 3.2–5.6)
ALBUMIN/GLOB SERPL: 1.4 (ref 0.7–2.0)
ALPHA 1: 0.2 g/dL (ref 0.1–0.4)
Alpha2 Glob SerPl Elph-Mcnc: 0.5 g/dL (ref 0.4–1.2)
B-Globulin SerPl Elph-Mcnc: 1.2 g/dL (ref 0.6–1.3)
Gamma Glob SerPl Elph-Mcnc: 1.2 g/dL (ref 0.5–1.6)
Globulin, Total: 3.1 g/dL (ref 2.0–4.5)
IGG (IMMUNOGLOBIN G), SERUM: 1287 mg/dL (ref 700–1600)
IgA/Immunoglobulin A, Serum: 250 mg/dL (ref 61–437)
IgM (Immunoglobulin M), Srm: 68 mg/dL (ref 20–172)
Total Protein: 7.2 g/dL (ref 6.0–8.5)

## 2015-01-22 LAB — B. BURGDORFI ANTIBODIES: Lyme IgG/IgM Ab: <0.91 {ISR} (ref 0.00–0.90)

## 2015-01-22 LAB — HIV ANTIBODY (ROUTINE TESTING W REFLEX): HIV SCREEN 4TH GENERATION: NONREACTIVE

## 2015-01-22 LAB — ANGIOTENSIN CONVERTING ENZYME: ANGIO CONVERT ENZYME: 54 U/L (ref 14–82)

## 2015-01-22 LAB — PAN-ANCA
ANCA Proteinase 3: <3.5 U/mL (ref 0.0–3.5)
C-ANCA: <1:20 {titer}
Myeloperoxidase Ab: <9 U/mL (ref 0.0–9.0)
P-ANCA: <1:20 {titer}

## 2015-01-22 LAB — ANA W/REFLEX: Anti Nuclear Antibody(ANA): NEGATIVE

## 2015-01-22 LAB — ACETYLCHOLINE RECEPTOR, BLOCKING: Acetylchol Block Ab: 20 % (ref 0–25)

## 2015-01-22 LAB — B12 AND FOLATE PANEL
Folate: 19.9 ng/mL (ref >3.0)
Vitamin B-12: 1091 pg/mL — ABNORMAL HIGH (ref 211–946)

## 2015-01-22 LAB — RHEUMATOID FACTOR: Rhuematoid fact SerPl-aCnc: 10.8 IU/mL (ref 0.0–13.9)

## 2015-01-22 LAB — ACETYLCHOLINE RECEPTOR, MODULATING: Acetylcholine Modulat Ab: <12 % (ref 0–20)

## 2015-01-22 LAB — PARANEOPLASTIC PROFILE 1
Neuronal Nuclear (Hu) Antibody (IB): <1:10 {titer}
Purkinje Cell (Yo) Autoantobodies- IFA: <1:10 {titer}

## 2015-01-22 LAB — RPR: RPR Ser Ql: NONREACTIVE

## 2015-02-03 ENCOUNTER — Ambulatory Visit (INDEPENDENT_AMBULATORY_CARE_PROVIDER_SITE_OTHER): Payer: BLUE CROSS/BLUE SHIELD | Admitting: Neurology

## 2015-02-03 VITALS — BP 138/81 | HR 94 | Temp 98.1°F | Ht 71.0 in | Wt 193.4 lb

## 2015-02-03 DIAGNOSIS — H4922 Sixth [abducent] nerve palsy, left eye: Secondary | ICD-10-CM

## 2015-02-03 DIAGNOSIS — H4942 Progressive external ophthalmoplegia, left eye: Secondary | ICD-10-CM

## 2015-02-03 DIAGNOSIS — T380X5A Adverse effect of glucocorticoids and synthetic analogues, initial encounter: Secondary | ICD-10-CM

## 2015-02-03 DIAGNOSIS — H532 Diplopia: Secondary | ICD-10-CM

## 2015-02-03 NOTE — Patient Instructions (Signed)
Overall you are doing fairly well but I do want to suggest a few things today:   Remember to drink plenty of fluid, eat healthy meals and do not skip any meals. Try to eat protein with a every meal and eat a healthy snack such as fruit or nuts in between meals. Try to keep a regular sleep-wake schedule and try to exercise daily, particularly in the form of walking, 20-30 minutes a day, if you can.   As far as your medications are concerned, I would like to suggest: 40 mg prednisone for 2 weeks 20 mg prednisone for 2 weeks  As far as diagnostic testing: MRI of the brain w/wo contrastr  I would like to see you back in 1 month, sooner if we need to. Please call us with any interim questions, concerns, problems, updates or refill requests.   Please also call us for any test results so we can go over those with you on the phone.  My clinical assistant and will answer any of your questions and relay your messages to me and also relay most of my messages to you.   Our phone number is [phone]. We also have an after hours call service for urgent matters and there is a physician on-call for urgent questions. For any emergencies you know to call 911 or go to the nearest emergency room

## 2015-02-04 ENCOUNTER — Telehealth: Payer: Self-pay

## 2015-02-04 LAB — BASIC METABOLIC PANEL
BUN/Creatinine Ratio: 16 (ref 10–22)
BUN: 16 mg/dL (ref 8–27)
CO2: 21 mmol/L (ref 18–29)
Calcium: 9.1 mg/dL (ref 8.6–10.2)
Chloride: 102 mmol/L (ref 97–108)
Creatinine, Ser: 1.03 mg/dL (ref 0.76–1.27)
GFR calc non Af Amer: 76 mL/min/{1.73_m2} (ref >59)
GFR, EST AFRICAN AMERICAN: 88 mL/min/{1.73_m2} (ref >59)
Glucose: 146 mg/dL — ABNORMAL HIGH (ref 65–99)
POTASSIUM: 4.1 mmol/L (ref 3.5–5.2)
Sodium: 138 mmol/L (ref 134–144)

## 2015-02-04 NOTE — Telephone Encounter (Signed)
VM was left to inform pt of lab results... He was instructed to give office a call back.  Thanks

## 2015-02-07 ENCOUNTER — Encounter: Payer: Self-pay | Admitting: Neurology

## 2015-02-07 NOTE — Progress Notes (Signed)
[facility]    Provider:  Dr Jaynee Eagles Referring Provider: Mayra Neer, MD Primary Care Physician:  Mayra Neer, MD  CC: diplopia  Interval update: 02/03/2015: Patient's diplopia is improving. His headache had resolved. He still has diplopia however. No further pain on eye movement. He is haivng no side effects from the prednisone except some slight insomnia. Reviewed images with patient, including thin cuts through the orbits images to show him the left orbital apex mass. Wil decrease steroids and repeat imaging.  Initial visit 01/04/2015: Todd [name] is a 64 y.o. male here as a referral from the hospital for diplopia. He is having pain on the left side of the head. Pain on eye movement. Headache, cramping to the middle of the head. No light sensitivity, no sound sensitivity, no nausea, no vomiting. Double vision in the left eye for 2 weeks, continuously all day. 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 Has had vision problems for years due to the glaucoma. But the double vision only for the last 2 weeks. Only when both eyes are open. Images Side by side. Worse when looking far away. Worse when looks to the left . It is getting worse. Headache is getting worse. Headache for 2 months. Headache worse during the day. Better at night. Ibuprofen helps. Is severe. Left eye movement is painful, severe pain. No inciting factors.   Reviewed notes, labs and imaging from outside physicians, which showed:  MR brain and Orbits: (personally reviewed images)  IMPRESSION: 1. 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 2. No other focal lesion of the orbits to explain diplopia. 3. Mild periventricular and subcortical T2 changes are slightly advanced for age. The finding is nonspecific but can be seen in the setting of chronic microvascular ischemia, a demyelinating process such as multiple sclerosis, vasculitis, complicated  migraine headaches, or as the sequelae of a prior infectious or inflammatory process. 4. No focal pathology within the occipital cortex.  Review of Systems: Patient complains of symptoms per HPI as well as the following symptoms:  loss of vision, frequency of urination, joint pain. Pertinent negatives per HPI. All others negative.   History   Social History  . Marital Status: Married    Spouse Name: N/A  . Number of Children: 4  . Years of Education: Bachelor's   Occupational History  . [place] History Main Topics  . Smoking status: Former Smoker -- 10 years    Types: Cigarettes    Quit date: 09/25/1976  . Smokeless tobacco: Not on file  . Alcohol Use: 0.0 oz/week    0 Standard drinks or equivalent per week     Comment: Once per month  . Drug Use: No  . Sexual Activity: Not on file   Other Topics Concern  . Not on file   Social History Narrative   Lives at home with wife.   Right handed.   Caffeine use: 1 cup coffee per day.   Occasionally drinks soda and/or tea     Family History  Problem Relation Age of Onset  . Glaucoma Mother   . Glaucoma Father   . Pancreatic cancer Mother     Past Medical History  Diagnosis Date  . Hypertension   . Arthritis   . Anxiety   . Elevated cholesterol   . Glaucoma     right worse than left  . High cholesterol   . Back pain  Fragments in vertebrae     Past Surgical History  Procedure Laterality Date  . Knee arthroscopy Left 1984    Current Outpatient Prescriptions  Medication Sig Dispense Refill  . aspirin 81 MG tablet Take 81 mg by mouth daily.    Marland Kitchen atenolol (TENORMIN) 25 MG tablet Take 25 mg by mouth daily.    . benzonatate (TESSALON) 100 MG capsule Take 1 capsule (100 mg total) by mouth every 8 (eight) hours. 21 capsule 0  . bimatoprost (LUMIGAN) 0.03 % ophthalmic solution Place 1 drop into both eyes at bedtime.    Marland Kitchen co-enzyme Q-10 30 MG capsule Take 30 mg by mouth daily as needed (with  joint pain).    . dorzolamide-timolol (COSOPT) 22.3-6.8 MG/ML ophthalmic solution Place 1 drop into both eyes 2 (two) times daily.    Marland Kitchen ezetimibe (ZETIA) 10 MG tablet Take 10 mg by mouth every evening.    Marland Kitchen HYDROcodone-acetaminophen (NORCO/VICODIN) 5-325 MG per tablet Take 1 tablet by mouth as needed.  0  . HYDROcodone-homatropine (HYCODAN) 5-1.5 MG/5ML syrup Take 5 mLs by mouth every 6 (six) hours as needed for cough. 120 mL 0  . ibuprofen (ADVIL,MOTRIN) 200 MG tablet Take 400 mg by mouth every 6 (six) hours as needed for headache.    . Multiple Vitamin (MULTIVITAMIN WITH MINERALS) TABS tablet Take 1 tablet by mouth daily.    . Omega-3 Fatty Acids (FISH OIL) 1200 MG CAPS Take 1 capsule by mouth daily.    . predniSONE (DELTASONE) 20 MG tablet Take 4 tablets in the morning for 3 days then take 3 tablets each morning until further instructions 100 tablet 0   No current facility-administered medications for this visit.    Allergies as of 02/03/2015 - Review Complete 02/03/2015  Allergen Reaction Noted  . Alphagan p [brimonidine tartrate] Other (See Comments) 12/29/2014  . Lipitor [atorvastatin]  01/04/2015    Vitals: BP 138/81 mmHg  Pulse 94  Temp(Src) 98.1 F (36.7 C)  Ht 5\' 11"  (1.803 m)  Wt 193 lb 6.4 oz (87.726 kg)  BMI 26.99 kg/m2 Last Weight:  Wt Readings from Last 1 Encounters:  02/03/15 193 lb 6.4 oz (87.726 kg)   Last Height:   Ht Readings from Last 1 Encounters:  02/03/15 5\' 11"  (1.803 m)    Cranial Nerves:  Right optic nerve pallor and atrophy, APD. Inability to abduct left eye, slightly improved can abduct past the midline. 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 Trigeminal sensation is intact and the muscles of mastication are normal. The face is symmetric. The palate elevates in the midline. Hearing intact. Voice is normal. Shoulder shrug is normal. The tongue has normal motion without fasciculations.      Assessment/Plan:  Patient has  a painful 6th nerve palsy. Improved on steroids. 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   Started empiric oral prednisone 60mg  daily. Will taper by 20mg  every 2 weeks. Will need to watch for side effects closely including glucose, blood pressure, mood  Left sixth palsy slightly improved. Pain resolved. Will taper steroids and repeat imaging.   Sarina Ill, MD  [facility] [address] [place], [place] [address]  Phone [phone] Fax [phone]  A total of 30 minutes was spent face-to-face with this patient. Over half this time was spent on counseling patient on the Trinity hunt diagnosis and different diagnostic and therapeutic  options available.

## 2015-03-08 ENCOUNTER — Ambulatory Visit (INDEPENDENT_AMBULATORY_CARE_PROVIDER_SITE_OTHER): Payer: BLUE CROSS/BLUE SHIELD | Admitting: Neurology

## 2015-03-08 ENCOUNTER — Encounter: Payer: Self-pay | Admitting: Neurology

## 2015-03-08 VITALS — BP 122/71 | HR 87 | Ht 71.0 in | Wt 194.0 lb

## 2015-03-08 DIAGNOSIS — H4922 Sixth [abducent] nerve palsy, left eye: Secondary | ICD-10-CM | POA: Diagnosis not present

## 2015-03-08 NOTE — Progress Notes (Signed)
[id number]    Provider:  Dr Jaynee Eagles Referring Provider: Mayra Neer, MD Primary Care Physician:  Mayra Neer, MD  CC: double vision  Interval update: 03/08/2015::  Todd [name] is a 64 y.o. male here for follow up.   His vision is 95% better. He has some intermittent blurry vision but overall it has resolved. The blurry vision is mostly at night when he is tired, he gets up at 330 every day. 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   Interval update: 02/03/2015: Patient's diplopia is improving. His headache had resolved. He still has diplopia however. No further pain on eye movement. He is haivng no side effects from the prednisone except some slight insomnia. Reviewed images with patient, including thin cuts through the orbits images to show him the left orbital apex mass. Wil decrease steroids and repeat imaging.  Initial visit 01/04/2015: Todd [name] is a 64 y.o. male here as a referral from the hospital for diplopia. He is having pain on the left side of the head. Pain on eye movement. Headache, cramping to the middle of the head. No light sensitivity, no sound sensitivity, no nausea, no vomiting. Double vision in the left eye for 2 weeks, continuously all day. 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 Has had vision problems for years due to the glaucoma. But the double vision only for the last 2 weeks. Only when both eyes are open. Images Side by side. Worse when looking far away. Worse when looks to the left . It is getting worse. Headache is getting worse. Headache for 2 months. Headache worse during the day. Better at night. Ibuprofen helps. Is severe. Left eye movement is painful, severe pain. No inciting factors.   Reviewed notes, labs and imaging from outside physicians, which showed:  MR brain and Orbits: (personally reviewed images)  IMPRESSION: 1. Atrophy of the right optic nerve without focal signal abnormality or  pathologic enhancement. 2. No other focal lesion of the orbits to explain diplopia. 3. Mild periventricular and subcortical T2 changes are slightly advanced for age. The finding is nonspecific but can be seen in the setting of chronic microvascular ischemia, a demyelinating process such as multiple sclerosis, vasculitis, complicated migraine headaches, or as the sequelae of a prior infectious or inflammatory process. 4. No focal pathology within the occipital cortex.   Review of Systems: Patient complains of symptoms per HPI as well as the following symptoms: double vision, joint pain. Pertinent negatives per HPI. All others negative.   History   Social History  . Marital Status: Married    Spouse Name: N/A  . Number of Children: 4  . Years of Education: Bachelor's   Occupational History  . [employer] History [name] Topics  . Smoking status: Former Smoker -- 10 years    Types: Cigarettes    Quit date: 09/25/1976  . Smokeless tobacco: Not on file  . Alcohol Use: 0.0 oz/week    0 Standard drinks or equivalent per week     Comment: Once per month  . Drug Use: No  . Sexual Activity: Not on file   Other Topics Concern  . Not on file   Social History Narrative   Lives at home with wife.   Right handed.   Caffeine use: 1 cup coffee per day.   Occasionally drinks soda and/or tea     Family History  Problem Relation Age of Onset  . Glaucoma Mother   .  Glaucoma Father   . Pancreatic cancer Mother     Past Medical History  Diagnosis Date  . Hypertension   . Arthritis   . Anxiety   . Elevated cholesterol   . Glaucoma     right worse than left  . High cholesterol   . Back pain     Fragments in vertebrae     Past Surgical History  Procedure Laterality Date  . Knee arthroscopy Left 1984    Current Outpatient Prescriptions  Medication Sig Dispense Refill  . aspirin 81 MG tablet Take 81 mg by mouth daily.    Marland Kitchen atenolol (TENORMIN) 25 MG tablet  Take 25 mg by mouth daily.    . benzonatate (TESSALON) 100 MG capsule Take 1 capsule (100 mg total) by mouth every 8 (eight) hours. 21 capsule 0  . bimatoprost (LUMIGAN) 0.03 % ophthalmic solution Place 1 drop into both eyes at bedtime.    Marland Kitchen co-enzyme Q-10 30 MG capsule Take 30 mg by mouth daily as needed (with joint pain).    . dorzolamide-timolol (COSOPT) 22.3-6.8 MG/ML ophthalmic solution Place 1 drop into both eyes 2 (two) times daily.    Marland Kitchen ezetimibe (ZETIA) 10 MG tablet Take 10 mg by mouth every evening.    Marland Kitchen HYDROcodone-acetaminophen (NORCO/VICODIN) 5-325 MG per tablet Take 1 tablet by mouth as needed.  0  . HYDROcodone-homatropine (HYCODAN) 5-1.5 MG/5ML syrup Take 5 mLs by mouth every 6 (six) hours as needed for cough. 120 mL 0  . ibuprofen (ADVIL,MOTRIN) 200 MG tablet Take 400 mg by mouth every 6 (six) hours as needed for headache.    . Multiple Vitamin (MULTIVITAMIN WITH MINERALS) TABS tablet Take 1 tablet by mouth daily.    . Omega-3 Fatty Acids (FISH OIL) 1200 MG CAPS Take 1 capsule by mouth daily.    . predniSONE (DELTASONE) 20 MG tablet Take 4 tablets in the morning for 3 days then take 3 tablets each morning until further instructions 100 tablet 0   No current facility-administered medications for this visit.    Allergies as of 03/08/2015 - Review Complete 03/08/2015  Allergen Reaction Noted  . Alphagan p [brimonidine tartrate] Other (See Comments) 12/29/2014  . Lipitor [atorvastatin]  01/04/2015    Vitals: BP 122/71 mmHg  Pulse 87  Ht 5\' 11"  (1.803 m)  Wt 194 lb (87.998 kg)  BMI 27.07 kg/m2 Last Weight:  Wt Readings from Last 1 Encounters:  03/08/15 194 lb (87.998 kg)   Last Height:   Ht Readings from Last 1 Encounters:  03/08/15 5\' 11"  (1.803 m)    Cranial Nerves:  Right optic nerve pallor and atrophy, APD. Right esotropia (chronic) otherwise EOMI. 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 Trigeminal sensation is intact and the muscles  of mastication are normal. The face is symmetric. The palate elevates in the midline. Hearing intact. Voice is normal. Shoulder shrug is normal. The tongue has normal motion without fasciculations.   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   Started empiric oral prednisone 60mg  daily. Tapered by 20mg  every 2 weeks and completed. Left sixth palsy resolved. Pain resolved. If symptoms recur will restart steroids and taper more slowly, over a longer period.  F/u as needed  Sarina Ill, MD  [facility] [address] [place], [place] [address]  Phone [phone] Fax [phone]  A total of 15 minutes was spent face-to-face with this patient. Over half this time was spent on counseling patient on the 6th nerve palsy diagnosis and different diagnostic and therapeutic options available and options should his symptoms reoccur.

## 2015-07-20 ENCOUNTER — Encounter: Payer: Self-pay | Admitting: Neurology

## 2015-07-20 ENCOUNTER — Telehealth: Payer: Self-pay | Admitting: Neurology

## 2015-07-20 ENCOUNTER — Ambulatory Visit (INDEPENDENT_AMBULATORY_CARE_PROVIDER_SITE_OTHER): Payer: BLUE CROSS/BLUE SHIELD | Admitting: Neurology

## 2015-07-20 VITALS — BP 148/86 | HR 68 | Ht 71.0 in | Wt 187.8 lb

## 2015-07-20 DIAGNOSIS — H53131 Sudden visual loss, right eye: Secondary | ICD-10-CM

## 2015-07-20 DIAGNOSIS — D496 Neoplasm of unspecified behavior of brain: Secondary | ICD-10-CM

## 2015-07-20 DIAGNOSIS — H1589 Other disorders of sclera: Secondary | ICD-10-CM

## 2015-07-20 DIAGNOSIS — H532 Diplopia: Secondary | ICD-10-CM | POA: Diagnosis not present

## 2015-07-20 DIAGNOSIS — H4720 Unspecified optic atrophy: Secondary | ICD-10-CM | POA: Diagnosis not present

## 2015-07-20 DIAGNOSIS — H4921 Sixth [abducent] nerve palsy, right eye: Secondary | ICD-10-CM

## 2015-07-20 DIAGNOSIS — G509 Disorder of trigeminal nerve, unspecified: Secondary | ICD-10-CM | POA: Diagnosis not present

## 2015-07-20 DIAGNOSIS — H11411 Vascular abnormalities of conjunctiva, right eye: Secondary | ICD-10-CM

## 2015-07-20 DIAGNOSIS — H4941 Progressive external ophthalmoplegia, right eye: Secondary | ICD-10-CM | POA: Diagnosis not present

## 2015-07-20 MED ORDER — PREDNISONE 20 MG PO TABS: 60.0000 mg | ORAL_TABLET | Freq: Every day | ORAL | Status: DC

## 2015-07-20 NOTE — Telephone Encounter (Signed)
Todd [name] - Patient's MRIS are going to cost him $1800 this time and last time they didn;t cost him anything. Any idea why? Is it because he is going to [place] long, would it be less expensive to have it done at [place]? Thanks

## 2015-07-20 NOTE — Progress Notes (Signed)
[facility]    [facility]    Provider: Dr Jaynee Eagles Referring Provider: Mayra Neer, MD Primary Care Physician: Mayra Neer, MD  CC: double vision  Interval history 07/20/2015: He is experiencing the same issue now but in a different eye. Not as severe. Symptoms started about 10 days ago. He is having pain in the back of the right eye, right temple having cramping pain, no pain chewing. Experiencing double vision. The double vision is side by side. Double vision only with both eyes open. He is having pain on eye movement. He has throbbing pain behind the right eye. This is the same as what happened last time except that was in the left eye and thought to be [name].   Interval update: 03/08/2015:: Todd [name] is a 64 y.o. male here for follow up.   His vision is 95% better. He has some intermittent blurry vision but overall it has resolved. The blurry vision is mostly at night when he is tired, he gets up at 330 every day. 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   Interval update: 02/03/2015: Patient's diplopia is improving. His headache had resolved. He still has diplopia however. No further pain on eye movement. He is haivng no side effects from the prednisone except some slight insomnia. Reviewed images with patient, including thin cuts through the orbits images to show him the left orbital apex mass. Wil decrease steroids and repeat imaging.  Initial visit 01/04/2015: Todd [name] is a 64 y.o. male here as a referral from the hospital for diplopia. He is having pain on the left side of the head. Pain on eye movement. Headache, cramping to the middle of the head. No light sensitivity, no sound sensitivity, no nausea, no vomiting. Double vision in the left eye for 2 weeks, continuously all day. 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 Has had vision problems for years due to the glaucoma. But the double  vision only for the last 2 weeks. Only when both eyes are open. Images Side by side. Worse when looking far away. Worse when looks to the left . It is getting worse. Headache is getting worse. Headache for 2 months. Headache worse during the day. Better at night. Ibuprofen helps. Is severe. Left eye movement is painful, severe pain. No inciting factors.   Reviewed notes, labs and imaging from outside physicians, which showed:  MR brain and Orbits: (personally reviewed images)  IMPRESSION: 1. 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 2. No other focal lesion of the orbits to explain diplopia. 3. Mild periventricular and subcortical T2 changes are slightly advanced for age. The finding is nonspecific but can be seen in the setting of chronic microvascular ischemia, a demyelinating process such as multiple sclerosis, vasculitis, complicated migraine headaches, or as the sequelae of a prior infectious or inflammatory process. 4. No focal pathology within the occipital cortex.   Review of Systems: Patient complains of symptoms per HPI as well as the following symptoms: double vision, joint pain. Pertinent negatives per HPI. All others negative.   Social History   Social History  . Marital Status: Married    Spouse Name: N/A  . Number of Children: 4  . Years of Education: Bachelor's   Occupational History  . [place] History Main Topics  . Smoking status: Former Smoker -- 10 years    Types: Cigarettes    Quit date:  09/25/1976  . Smokeless tobacco: Not on file  . Alcohol Use: 0.0 oz/week    0 Standard drinks or equivalent per week     Comment: Once per month  . Drug Use: No  . Sexual Activity: Not on file   Other Topics Concern  . Not on file   Social History Narrative   Lives at home with wife.   Right handed.   Caffeine use: 1 cup coffee per day.   Occasionally drinks soda and/or tea     Family  History  Problem Relation Age of Onset  . Glaucoma Mother   . Glaucoma Father   . Pancreatic cancer Mother     Past Medical History  Diagnosis Date  . Hypertension   . Arthritis   . Anxiety   . Elevated cholesterol   . Glaucoma     right worse than left  . High cholesterol   . Back pain     Fragments in vertebrae     Past Surgical History  Procedure Laterality Date  . Knee arthroscopy Left 1984    Current Outpatient Prescriptions  Medication Sig Dispense Refill  . aspirin 81 MG tablet Take 81 mg by mouth daily.    Marland Kitchen atenolol (TENORMIN) 25 MG tablet Take 25 mg by mouth daily.    . benzonatate (TESSALON) 100 MG capsule Take 1 capsule (100 mg total) by mouth every 8 (eight) hours. 21 capsule 0  . bimatoprost (LUMIGAN) 0.03 % ophthalmic solution Place 1 drop into both eyes at bedtime.    Marland Kitchen co-enzyme Q-10 30 MG capsule Take 30 mg by mouth daily as needed (with joint pain).    . dorzolamide-timolol (COSOPT) 22.3-6.8 MG/ML ophthalmic solution Place 1 drop into both eyes 2 (two) times daily.    Marland Kitchen ezetimibe (ZETIA) 10 MG tablet Take 10 mg by mouth every evening.    Marland Kitchen HYDROcodone-acetaminophen (NORCO/VICODIN) 5-325 MG per tablet Take 1 tablet by mouth as needed.  0  . HYDROcodone-homatropine (HYCODAN) 5-1.5 MG/5ML syrup Take 5 mLs by mouth every 6 (six) hours as needed for cough. 120 mL 0  . ibuprofen (ADVIL,MOTRIN) 200 MG tablet Take 400 mg by mouth every 6 (six) hours as needed for headache.    . Multiple Vitamin (MULTIVITAMIN WITH MINERALS) TABS tablet Take 1 tablet by mouth daily.    . Omega-3 Fatty Acids (FISH OIL) 1200 MG CAPS Take 1 capsule by mouth daily.    . predniSONE (DELTASONE) 20 MG tablet Take 4 tablets in the morning for 3 days then take 3 tablets each morning until further instructions 100 tablet 0   No current facility-administered medications for this visit.    Allergies as of 07/20/2015 - Review Complete 07/20/2015  Allergen Reaction Noted  . Alphagan p  [brimonidine tartrate] Other (See Comments) 12/29/2014  . Lipitor [atorvastatin]  01/04/2015    Vitals: BP 148/86 mmHg  Pulse 68  Ht 5\' 11"  (1.803 m)  Wt 187 lb 12.8 oz (85.186 kg)  BMI 26.20 kg/m2 Last Weight:  Wt Readings from Last 1 Encounters:  07/20/15 187 lb 12.8 oz (85.186 kg)   Last Height:   Ht Readings from Last 1 Encounters:  07/20/15 5\' 11"  (1.803 m)    Cranial Nerves:  Right optic nerve pallor and atrophy, APD. Right esotropia, Right sixth nerve palsy. Decreased corneal reflex right eye. otherwise EOMI. Visual fields are limited in the right eye due to sixth nerve palsy and severe glaucoma. Trigeminal face sensation is intact and the muscles  of mastication are normal. The face is symmetric. The palate elevates in the midline. Hearing intact. Voice is normal. Shoulder shrug is normal. The tongue has normal motion without fasciculations.   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 He had a painful left 6th nerve palsy that resolved on steroids thought to be [employer]. In the past, review of MRI or the orbits may show enhancing soft tissue mass at the left orbital apex, possibly suggesting tolosa hunt. Unclear if this can be the same etiology of the right eye. Will repeat MRI of the brain and orbits.  IV solumedrol today 1g. Started empiric oral prednisone 60mg  daily. Will taper by 20mg  every 2 weeks.  Can consider immunosuppressant possibly remicade if patient continues to have episodes.   Sarina Ill, MD  [facility] [address] [place], [place] [address]  Phone [phone] Fax [phone]  A total of 30 minutes was spent face-to-face with this patient. Over half this time was spent on counseling patient on the right 6th and 5th nerve palsy diagnosis and different diagnostic and therapeutic options available and options should his symptoms reoccur.

## 2015-07-20 NOTE — Patient Instructions (Signed)
Remember to drink plenty of fluid, eat healthy meals and do not skip any meals. Try to eat protein with a every meal and eat a healthy snack such as fruit or nuts in between meals. Try to keep a regular sleep-wake schedule and try to exercise daily, particularly in the form of walking, 20-30 minutes a day, if you can.   As far as your medications are concerned, I would like to suggest: Start prednisone 60mg  daily  As far as diagnostic testing: MRi brain w/wo contrast  I would like to see you back in 2 weeks , sooner if we need to. Please call us with any interim questions, concerns, problems, updates or refill requests.   Our phone number is [phone]. We also have an after hours call service for urgent matters and there is a physician on-call for urgent questions. For any emergencies you know to call 911 or go to the nearest emergency room

## 2015-07-21 ENCOUNTER — Ambulatory Visit (HOSPITAL_COMMUNITY): Payer: BLUE CROSS/BLUE SHIELD

## 2015-07-21 NOTE — Telephone Encounter (Signed)
That's better, would you let patient know please?

## 2015-07-21 NOTE — Telephone Encounter (Signed)
[facility] next available will be until 11/4. I called and checked his insurance and confirmed that he will be covered @ 100% if billed by in-network facility. I left a message from Corwin Levins # [phone] in regards to him being covered @ 100%.  Katrina is the person that advised patient of his copay. I am waiting to hear back from her. Thanks!

## 2015-11-04 ENCOUNTER — Encounter: Payer: Self-pay | Admitting: Neurology

## 2015-11-04 ENCOUNTER — Ambulatory Visit (INDEPENDENT_AMBULATORY_CARE_PROVIDER_SITE_OTHER): Payer: BLUE CROSS/BLUE SHIELD | Admitting: Neurology

## 2015-11-04 VITALS — BP 146/87 | HR 67 | Ht 71.0 in | Wt 191.6 lb

## 2015-11-04 DIAGNOSIS — R51 Headache with orthostatic component, not elsewhere classified: Secondary | ICD-10-CM

## 2015-11-04 DIAGNOSIS — H5713 Ocular pain, bilateral: Secondary | ICD-10-CM | POA: Diagnosis not present

## 2015-11-04 DIAGNOSIS — G4489 Other headache syndrome: Secondary | ICD-10-CM | POA: Diagnosis not present

## 2015-11-04 DIAGNOSIS — H4943 Progressive external ophthalmoplegia, bilateral: Secondary | ICD-10-CM | POA: Diagnosis not present

## 2015-11-04 DIAGNOSIS — H5712 Ocular pain, left eye: Secondary | ICD-10-CM

## 2015-11-04 MED ORDER — ALPRAZOLAM 0.25 MG PO TABS: ORAL_TABLET | ORAL | Status: DC

## 2015-11-04 MED ORDER — TRAMADOL HCL 50 MG PO TABS: 50.0000 mg | ORAL_TABLET | Freq: Four times a day (QID) | ORAL | Status: DC | PRN

## 2015-11-04 NOTE — Progress Notes (Signed)
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 He had a painful left 6th and right 6th nerve palsy that resolved on steroids within the last year. 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 Symptoms started in January.  Took high dose steroids x2 in the past and got better. Now he is worse again. He has headache and eye pain,. Changes in vision. The pain is on the right side of the head, pressure, pain, vision changes and this is similar to how he developed in the past. Worst headache in the morning. Every day, worsening can be a 9/10.   Interval history 07/20/2015: He is experiencing the same issue now but in a different eye. Not as severe. Symptoms started about 10 days ago. He is having pain in the back of the right eye, right temple having cramping pain, no pain chewing. Experiencing double vision. The double vision is side by side. Double vision only with both eyes open. He is having pain on eye movement. He has throbbing pain behind the right eye. This is the same as what happened last time except that was in the left eye and thought to be [name].   Interval update: 03/08/2015:: BREYLON SHERROW is a 65 y.o. male here for follow up.   His vision is 95% better. He has some intermittent blurry vision but overall it has resolved. The blurry vision is mostly at night when he is tired, he gets up at 330 every day. 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   Interval update: 02/03/2015: Patient's diplopia is improving. His headache had resolved. He still has diplopia however. No further pain on eye movement. He is haivng no side effects from the prednisone  except some slight insomnia. Reviewed images with patient, including thin cuts through the orbits images to show him the left orbital apex mass. Wil decrease steroids and repeat imaging.  Initial visit 01/04/2015: VIBHAV WADDILL is a 65 y.o. male here as a referral from the hospital for diplopia. He is having pain on the left side of the head. Pain on eye movement. Headache, cramping to the middle of the head. No light sensitivity, no sound sensitivity, no nausea, no vomiting. Double vision in the left eye for 2 weeks, continuously all day. 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 Has had vision problems for years due to the glaucoma. But the double vision only for the last 2 weeks. Only when both eyes are open. Images Side by side. Worse when looking far away. Worse when looks to the left . It is getting worse. Headache is getting worse. Headache for 2 months. Headache worse during the day. Better at night. Ibuprofen helps. Is severe. Left eye movement is painful, severe pain. No inciting factors.   Reviewed notes, labs and imaging from outside physicians, which showed:  MR brain and Orbits: (personally reviewed images)  IMPRESSION: 1. 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 2. No other focal lesion of the orbits to explain diplopia. 3. Mild periventricular and subcortical T2 changes are slightly advanced for age. The finding is nonspecific but can be seen in the setting of  chronic microvascular ischemia, a demyelinating process such as multiple sclerosis, vasculitis, complicated migraine headaches, or as the sequelae of a prior infectious or inflammatory process. 4. No focal pathology within the occipital cortex.  Review of Systems: Patient complains of symptoms per HPI as well as the following symptoms: eye pain, frequency of urination, joint pain, swelling, back pain, headache, numbness. Pertinent negatives per HPI. All others  negative.   Social History   Social History  . Marital Status: Married    Spouse Name: N/A  . Number of Children: 4  . Years of Education: Bachelor's   Occupational History  . [place] History Main Topics  . Smoking status: Former Smoker -- 10 years    Types: Cigarettes    Quit date: 09/25/1976  . Smokeless tobacco: Not on file  . Alcohol Use: 0.0 oz/week    0 Standard drinks or equivalent per week     Comment: Once per month  . Drug Use: No  . Sexual Activity: Not on file   Other Topics Concern  . Not on file   Social History Narrative   Lives at home with wife.   Right handed.   Caffeine use: 1 cup coffee per day.   Occasionally drinks soda and/or tea     Family History  Problem Relation Age of Onset  . Glaucoma Mother   . Glaucoma Father   . Pancreatic cancer Mother     Past Medical History  Diagnosis Date  . Hypertension   . Arthritis   . Anxiety   . Elevated cholesterol   . Glaucoma     right worse than left  . High cholesterol   . Back pain     Fragments in vertebrae     Past Surgical History  Procedure Laterality Date  . Knee arthroscopy Left 1984    Current Outpatient Prescriptions  Medication Sig Dispense Refill  . aspirin 81 MG tablet Take 81 mg by mouth daily.    Marland Kitchen atenolol (TENORMIN) 25 MG tablet Take 25 mg by mouth daily.    . benzonatate (TESSALON) 100 MG capsule Take 1 capsule (100 mg total) by mouth every 8 (eight) hours. 21 capsule 0  . co-enzyme Q-10 30 MG capsule Take 30 mg by mouth daily as needed (with joint pain).    . dorzolamide-timolol (COSOPT) 22.3-6.8 MG/ML ophthalmic solution Place 1 drop into both eyes 2 (two) times daily.    Marland Kitchen ezetimibe (ZETIA) 10 MG tablet Take 10 mg by mouth every evening.    Marland Kitchen HYDROcodone-acetaminophen (NORCO/VICODIN) 5-325 MG per tablet Take 1 tablet by mouth as needed.  0  . HYDROcodone-homatropine (HYCODAN) 5-1.5 MG/5ML syrup Take 5 mLs by mouth every 6 (six) hours as needed  for cough. 120 mL 0  . ibuprofen (ADVIL,MOTRIN) 200 MG tablet Take 400 mg by mouth every 6 (six) hours as needed for headache.    . LUMIGAN 0.01 % SOLN Place 1 drop into both eyes daily. At bedtime    . Multiple Vitamin (MULTIVITAMIN WITH MINERALS) TABS tablet Take 1 tablet by mouth daily.    . Omega-3 Fatty Acids (FISH OIL) 1200 MG CAPS Take 1 capsule by mouth daily.    . predniSONE (DELTASONE) 20 MG tablet Take 3 tablets (60 mg total) by mouth daily. 90 tablet 0   No current facility-administered medications for this visit.    Allergies as of 11/04/2015 - Review Complete 11/04/2015  Allergen Reaction Noted  . Alphagan p [brimonidine  tartrate] Other (See Comments) 12/29/2014  . Lipitor [atorvastatin]  01/04/2015    Vitals: BP 146/87 mmHg  Pulse 67  Ht 5' 11"  (1.803 m)  Wt 191 lb 9.6 oz (86.909 kg)  BMI 26.73 kg/m2 Last Weight:  Wt Readings from Last 1 Encounters:  11/04/15 191 lb 9.6 oz (86.909 kg)   Last Height:   Ht Readings from Last 1 Encounters:  11/04/15 5' 11"  (1.803 m)    Cranial Nerves:  Right optic nerve pallor and atrophy, APD. Right esotropia, Right sixth nerve palsy. Decreased corneal reflex right eye. otherwise EOMI. Visual fields are limited in the right eye due to sixth nerve palsy and severe glaucoma. Trigeminal face sensation is intact and the muscles of mastication are normal. The face is symmetric. The palate elevates in the midline. Hearing intact. Voice is normal. Shoulder shrug is normal. The tongue has normal motion without fasciculations.   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 He had a painful left 6th as well as right 6th nerve palsy that resolved on steroids within the last year. In the past, review of MRI or the orbits may show enhancing soft tissue mass at the left orbital apex, possibly suggesting tolosa hunt. He could not afford repeat imaging at last event, highly recommend it now.  Unclear if this can be the same  etiology of the right eye again. Will repeat MRI of the brain and orbits. ESR and CRP. Needs lumbar puncture.  csf cell count and diff, gram stain, protein, glucose, csf culture( for bacteria, fungus and mycobacteria), VDRL, cytology, oligoclonal bands, csf igg index , opening pressure, lyme pcr, ace,   Will try and get LP and MRIs in the next day and then start empiric oral prednisone 32m daily again. Will taper by 223mevery 2 weeks. Can consider immunosuppressant possibly remicade after imaging.   AnSarina IllMD  [facility] [id number]usr[place] [address]Phone [phone] total of 40 minutes was spent face-to-face with this patient. Over half this time was spent on counseling patient on the right 6th and 5th nerve palsy diagnosis and different diagnostic and therapeutic options available and options should his symptoms reoccur.

## 2015-11-05 ENCOUNTER — Telehealth: Payer: Self-pay | Admitting: *Deleted

## 2015-11-05 LAB — CBC
Hematocrit: 39.4 % (ref 37.5–51.0)
Hemoglobin: 13.5 g/dL (ref 12.6–17.7)
MCH: 32.5 pg (ref 26.6–33.0)
MCHC: 34.3 g/dL (ref 31.5–35.7)
MCV: 95 fL (ref 79–97)
PLATELETS: 226 10*3/uL (ref 150–379)
RBC: 4.15 x10E6/uL (ref 4.14–5.80)
RDW: 13.2 % (ref 12.3–15.4)
WBC: 5.9 10*3/uL (ref 3.4–10.8)

## 2015-11-05 LAB — COMPREHENSIVE METABOLIC PANEL
A/G RATIO: 1.7 (ref 1.1–2.5)
ALBUMIN: 4.1 g/dL (ref 3.6–4.8)
ALT: 16 IU/L (ref 0–44)
AST: 19 IU/L (ref 0–40)
Alkaline Phosphatase: 45 IU/L (ref 39–117)
BUN / CREAT RATIO: 8 — AB (ref 10–22)
BUN: 8 mg/dL (ref 8–27)
Bilirubin Total: 0.8 mg/dL (ref 0.0–1.2)
CALCIUM: 9.3 mg/dL (ref 8.6–10.2)
CO2: 23 mmol/L (ref 18–29)
CREATININE: 1.05 mg/dL (ref 0.76–1.27)
Chloride: 103 mmol/L (ref 96–106)
GFR calc Af Amer: 86 mL/min/{1.73_m2} (ref >59)
GFR, EST NON AFRICAN AMERICAN: 74 mL/min/{1.73_m2} (ref >59)
GLOBULIN, TOTAL: 2.4 g/dL (ref 1.5–4.5)
Glucose: 101 mg/dL — ABNORMAL HIGH (ref 65–99)
Potassium: 4.5 mmol/L (ref 3.5–5.2)
SODIUM: 142 mmol/L (ref 134–144)
Total Protein: 6.5 g/dL (ref 6.0–8.5)

## 2015-11-05 LAB — C-REACTIVE PROTEIN: CRP: <0.3 mg/L (ref 0.0–4.9)

## 2015-11-05 LAB — SEDIMENTATION RATE: SED RATE: 2 mm/h (ref 0–30)

## 2015-11-05 LAB — NEUROMYELITIS OPTICA AUTOAB, IGG: NMO IgG Autoantibodies: <1.5 U/mL (ref 0.0–3.0)

## 2015-11-05 NOTE — Telephone Encounter (Signed)
Todd [name] from [facility] imaging called and advised she was going to call pt to schedule LP and saw Dr Jaynee Eagles also ordered a couple MRI's. She stated typically MRI's are completed prior to LP. Advised that someone tried calling him to schedule yesterday. I asked if she could have their scheduling department try and contact pt again to schedule. She stated she will speak to Shirlean Mylar, who can call pt again. She is going to f/u on Monday about LP scheduling. Told her to call if she needs anything further.

## 2015-11-05 NOTE — Telephone Encounter (Signed)
Called and LVM for Jennifer in spine services to schedule pt for LP. Advised orders in computer. Told her to call back if she needs anything further.

## 2015-11-06 NOTE — Telephone Encounter (Signed)
Spoke with patient he is scheduled next Saturday for imaging, has not scheduled LP. His symptoms are slightly worsening and I need to get him into the scanner asap. Can we get him in any sooner? I have tried calling [facility] and no one answers, just music for 30 minutes.

## 2015-11-08 ENCOUNTER — Ambulatory Visit
Admission: RE | Admit: 2015-11-08 | Discharge: 2015-11-08 | Disposition: A | Discharge status: Home / Self Care | Payer: BLUE CROSS/BLUE SHIELD | Source: Ambulatory Visit | Attending: Neurology | Admitting: Neurology

## 2015-11-08 ENCOUNTER — Telehealth: Payer: Self-pay | Admitting: *Deleted

## 2015-11-08 DIAGNOSIS — R51 Headache with orthostatic component, not elsewhere classified: Secondary | ICD-10-CM

## 2015-11-08 DIAGNOSIS — H5712 Ocular pain, left eye: Secondary | ICD-10-CM

## 2015-11-08 DIAGNOSIS — H5713 Ocular pain, bilateral: Secondary | ICD-10-CM

## 2015-11-08 DIAGNOSIS — G4489 Other headache syndrome: Secondary | ICD-10-CM

## 2015-11-08 DIAGNOSIS — H4943 Progressive external ophthalmoplegia, bilateral: Secondary | ICD-10-CM

## 2015-11-08 MED ORDER — GADOBENATE DIMEGLUMINE 529 MG/ML IV SOLN
18.0000 mL | Freq: Once | INTRAVENOUS | Status: AC | PRN
  Administered 2015-11-08: 18 mL via INTRAVENOUS

## 2015-11-08 NOTE — Telephone Encounter (Signed)
LVM for pt to call about results. Gave [facility] phone number. Ok to inform pt labs normal per Dr Jaynee Eagles.

## 2015-11-08 NOTE — Telephone Encounter (Signed)
Spoke with Juliann Pulse from [facility] this morning 11/08/15 and she advise me that she would call the patient to get him in sooner than 11/13/15. Juliann Pulse will call me to let me know when his appointment is moved up.  Thanks!

## 2015-11-08 NOTE — Telephone Encounter (Signed)
-----   Message from Melvenia Beam, MD sent at 11/05/2015  7:50 PM EST ----- Labs all normal

## 2015-11-08 NOTE — Telephone Encounter (Signed)
Patient returned Emma's call, advised labs normal.

## 2015-11-09 ENCOUNTER — Telehealth: Payer: Self-pay | Admitting: Neurology

## 2015-11-09 ENCOUNTER — Telehealth: Payer: Self-pay | Admitting: *Deleted

## 2015-11-09 DIAGNOSIS — H4943 Progressive external ophthalmoplegia, bilateral: Secondary | ICD-10-CM

## 2015-11-09 MED ORDER — PREDNISONE 10 MG PO TABS: ORAL_TABLET | ORAL | Status: DC

## 2015-11-09 NOTE — Telephone Encounter (Signed)
Spoke to Camden at [employer] [employer]. She stated she spoke to pt about scheduling LP. 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 He has to ask for work off first. He is going to contact caroline once he can arrange this.

## 2015-11-09 NOTE — Telephone Encounter (Signed)
Spoke with patient. Patient with repeated painful 6th nerve palsy with periorbital pain . Initially it was in the left eye in 12/2014 and a review of MRI April 2016 showed enhancing soft tissue mass at left orbital apex. Patient has had similar right eye symptoms twice since. 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 An LP is pending.   Will start oral prednisone again which has resolved symptoms 3x now. 60mg  then taper by 10mg  every week. Need to monitor glucose, blood pressure, mood.   Will cc his pcp Serita Grammes

## 2015-11-10 ENCOUNTER — Telehealth: Payer: Self-pay | Admitting: Neurology

## 2015-11-10 NOTE — Telephone Encounter (Signed)
Patient is calling. He states he is constipated and wants to know if he can take a laxative since he is taking predniSONE (DELTASONE) 10 MG tablet and traMADol (ULTRAM) 50 MG tablet. Please call and advise.

## 2015-11-10 NOTE — Telephone Encounter (Signed)
Chrys Racer with [facility] called and they do not do the Mycobacteria Culture. It will need to be drawn separately here at our office or another lab. [phone]

## 2015-11-11 NOTE — Telephone Encounter (Signed)
Per Dr Jaynee Eagles- ok for pt to take laxative.

## 2015-11-11 NOTE — Telephone Encounter (Signed)
Per Dr Jaynee Eagles- ok. We are going to hold off on this lab.

## 2015-11-11 NOTE — Telephone Encounter (Signed)
Called pt back and LVM that it is ok per Dr Jaynee Eagles for him to take laxative. Gave [facility] phone number if he has further questions.

## 2015-11-12 ENCOUNTER — Ambulatory Visit
Admission: RE | Admit: 2015-11-12 | Discharge: 2015-11-12 | Disposition: A | Discharge status: Home / Self Care | Payer: BLUE CROSS/BLUE SHIELD | Source: Ambulatory Visit | Attending: Neurology | Admitting: Neurology

## 2015-11-12 ENCOUNTER — Other Ambulatory Visit (HOSPITAL_COMMUNITY)
Admission: RE | Admit: 2015-11-12 | Discharge: 2015-11-12 | Disposition: A | Discharge status: Home / Self Care | Payer: BLUE CROSS/BLUE SHIELD | Source: Ambulatory Visit | Attending: Neurology | Admitting: Neurology

## 2015-11-12 ENCOUNTER — Other Ambulatory Visit: Payer: Self-pay | Admitting: Neurology

## 2015-11-12 DIAGNOSIS — G4489 Other headache syndrome: Secondary | ICD-10-CM | POA: Diagnosis not present

## 2015-11-12 DIAGNOSIS — R51 Headache with orthostatic component, not elsewhere classified: Secondary | ICD-10-CM

## 2015-11-12 DIAGNOSIS — H5713 Ocular pain, bilateral: Secondary | ICD-10-CM | POA: Diagnosis not present

## 2015-11-12 DIAGNOSIS — H5712 Ocular pain, left eye: Secondary | ICD-10-CM

## 2015-11-12 DIAGNOSIS — H4943 Progressive external ophthalmoplegia, bilateral: Secondary | ICD-10-CM | POA: Insufficient documentation

## 2015-11-12 LAB — PROTEIN, CSF: TOTAL PROTEIN, CSF: 62 mg/dL — AB (ref 15–45)

## 2015-11-12 LAB — CSF CELL COUNT WITH DIFFERENTIAL
RBC Count, CSF: 1 cu mm — ABNORMAL HIGH
TUBE #: 3
WBC, CSF: <10 cu mm (ref 0–5)

## 2015-11-12 LAB — GLUCOSE, CSF: Glucose, CSF: 74 mg/dL (ref 43–76)

## 2015-11-12 NOTE — Progress Notes (Signed)
One SST tube of blood drawn from right AC space for LP labs; site unremarkable. 

## 2015-11-12 NOTE — Discharge Instructions (Signed)

## 2015-11-13 ENCOUNTER — Other Ambulatory Visit: Payer: BLUE CROSS/BLUE SHIELD

## 2015-11-15 ENCOUNTER — Telehealth: Payer: Self-pay | Admitting: *Deleted

## 2015-11-15 LAB — M. TUBERCULOSIS COMPLEX BY PCR: M. tuberculosis, Direct: NOT DETECTED

## 2015-11-15 NOTE — Telephone Encounter (Signed)
[name] called to report stat CFS culture lab results: WBC present both pmn and mononuclear, no organisms seen. No growth three days. Report will be faxed. Dr Jaynee Eagles notified and given faxed report.

## 2015-11-17 LAB — CSF CULTURE W GRAM STAIN
Gram Stain: NONE SEEN
Organism ID, Bacteria: NO GROWTH

## 2015-11-17 LAB — CSF CULTURE

## 2015-11-17 LAB — VDRL, CSF: VDRL Quant, CSF: NONREACTIVE

## 2015-11-17 LAB — ANGIOTENSIN CONVERTING ENZYME, CSF: ACE, CSF: 9 U/L (ref <=15)

## 2015-11-18 LAB — CNS IGG SYNTHESIS RATE, CSF+BLOOD
Albumin, CSF: 37.5 mg/dL (ref 8.0–42.0)
Albumin, Serum(Neph): 3.8 g/dL (ref 3.2–4.6)
IGG INDEX, CSF: 0.41 (ref <0.66)
IGG, CSF: 5.1 mg/dL (ref 0.8–7.7)
IGG, SERUM: 1250 mg/dL (ref 694–1618)
MS CNS IGG SYNTHESIS RATE: -6.3 mg/(24.h) (ref <=3.3)

## 2015-11-18 LAB — OLIGOCLONAL BANDS, CSF + SERM

## 2015-11-22 ENCOUNTER — Telehealth: Payer: Self-pay | Admitting: *Deleted

## 2015-11-22 NOTE — Telephone Encounter (Signed)
-----   Message from Melvenia Beam, MD sent at 11/20/2015  1:21 PM EST ----- The results from his lumbar puncture have been unremarkable. Did this to rule out other causes of his symptoms including malignancy which was negative. No other infections or inflammatory cause for his symptoms. We can continue to treat with prednisone.

## 2015-11-22 NOTE — Telephone Encounter (Signed)
LVM for pt to call about results. Gave [facility] phone number.  

## 2015-11-23 NOTE — Telephone Encounter (Signed)
Called pt back and relayed message below about results per Dr Jaynee Eagles. He is going to call back to make f/u appt in March. He wanted to hold off right now. Told him to call back if he has any further questions.

## 2015-11-23 NOTE — Telephone Encounter (Signed)
Pt returned Emma's call °

## 2015-12-08 LAB — FUNGUS CULTURE W SMEAR: Smear Result: NONE SEEN

## 2016-01-28 ENCOUNTER — Other Ambulatory Visit: Payer: Self-pay | Admitting: Neurology

## 2016-01-31 NOTE — Telephone Encounter (Signed)
Called pt. Left detailed VM.  Advised we received refill request for prednisone from his pharmacy. Advised he should have been tapering off of this per Dr Jaynee Eagles. Advised Dr Jaynee Eagles wants him to come to office for f/u to discuss everything. Gave [facility] phone number for him to call back. 8am available Thursday if pt can come then.

## 2016-02-07 ENCOUNTER — Telehealth: Payer: Self-pay | Admitting: Neurology

## 2016-02-07 DIAGNOSIS — H4943 Progressive external ophthalmoplegia, bilateral: Secondary | ICD-10-CM

## 2016-02-07 NOTE — Telephone Encounter (Addendum)
Pt called said he is having problem with the rt eye again for the past 2 weeks. Please call  TREG NANNY PA:[phone]  [place] [facility]  [phone] * 03-Jan-2051  PLEASE CALL RE: HIS EYE

## 2016-02-07 NOTE — Telephone Encounter (Signed)
Called pt back. He stated he has pain/pressure in his right eye. He has blurry vision. He has had these sx for the past 2 weeks. He states he has been calling our office. I advised I have tried calling him back, but was not able to reach him. He verbalized understanding. Made earlier f/u for 02/08/16 at 10am, check in 945am. Ok per Dr Jaynee Eagles to fit pt in. Advised him to go to ED but he states he wants to wait until he sees Dr Jaynee Eagles tomorrow.   Per Dr Jaynee Eagles, I advised we placed referral to Dr Sanda Klein at [facility] who is a neurophthalmologist. We want him to be further evaluated by him. He verbalized understanding. Advised we will discuss tomorrow at appt as well.

## 2016-02-08 ENCOUNTER — Ambulatory Visit (INDEPENDENT_AMBULATORY_CARE_PROVIDER_SITE_OTHER): Payer: BLUE CROSS/BLUE SHIELD | Admitting: Neurology

## 2016-02-08 VITALS — BP 130/76 | HR 70 | Ht 71.0 in | Wt 187.4 lb

## 2016-02-08 DIAGNOSIS — H4943 Progressive external ophthalmoplegia, bilateral: Secondary | ICD-10-CM | POA: Diagnosis not present

## 2016-02-08 DIAGNOSIS — H4901 Third [oculomotor] nerve palsy, right eye: Secondary | ICD-10-CM | POA: Diagnosis not present

## 2016-02-08 MED ORDER — PREDNISONE 20 MG PO TABS: 60.0000 mg | ORAL_TABLET | Freq: Every day | ORAL | Status: DC

## 2016-02-08 MED ORDER — PREDNISONE 20 MG PO TABS: 20.0000 mg | ORAL_TABLET | Freq: Every day | ORAL | Status: DC

## 2016-02-08 NOTE — Patient Instructions (Signed)
As far as your medications are concerned, I would like to suggest: Prednisone 60mg  a day for 2 weeks. Then decrease to 40mg  a day for 1 week. Then 20mg  for one week and hold and come back and see me. In the meantime witll work on getting appoitnment with dr. Sanda Klein (neuro-ophthalmologist at [facility])  I would like to see you back in 4 weeks, sooner if we need to. Please call us with any interim questions, concerns, problems, updates or refill requests.   If symptoms do not improve in 7-10 days call asap and talk to [place] phone number is [phone]. We also have an after hours call service for urgent matters and there is a physician on-call for urgent questions. For any emergencies you know to call 911 or go to the nearest emergency room

## 2016-02-08 NOTE — Progress Notes (Signed)
[facility]  Provider:  Dr Jaynee Eagles Referring Provider: Mayra Neer, MD Primary Care Physician:  Mayra Neer, MD  CC:  Headache, double vision, progressive and worsening  HPI:  JERMERY CARATACHEA is a 65 y.o. male here as a referral from Dr. Brigitte Pulse for repeated episodes of painful ophthalmoplegia resolved with high dose steroids suspicious of [name] Syndrome  Interval history 02/09/2016: Patient is a lovely 65 year old gentleman who is here for another episode of diplopia this time with right eye ptosis as well. 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 He was originally seen by me in April 2016 for diplopia and left-sided eye pain of unknown etiology. He was having pain on the left side of the head, pain on eye movement, headache, cramping to the middle of the head without light sensitivity, no sound sensitivity, no nausea, no vomiting  He was evaluated inpatient and MRIs(MRI brain, MRI orbits, MRV) and labs(esr,crp) did not reveal etiology. 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  Further re-examination of MRI imaging of the orbits showed enhancing soft tissue mass at the left orbital apex, possibly suggesting tolosa hunt. The symptoms resolved with a course of high-dose steroids.  Patient returned in October 2016 with similar problems in the opposite eye, the right eye. He described pain in the back of the right eye, right temple cramping pain, double vision, side by side, with both eyes open, pain on eye movement. He had throbbing pain behind the right eye. 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 MRI of the orbits and brain now showed "asymmetry of the cavernous sinuses due to enhancing soft tissue surrounding the right internal carotid artery. 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 This has occurred since the MRI dated 12/29/2014. At that time, there appeared to be similar  changes involving the left cavernous sinus." Patient's symptoms again resolved with high-dose steroids. 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 Onset 10 days ago and progressive with vision changes(diplopia).Patient cannot afford anymore imaging. He describes the same symptoms of headaches, pressure behind the eyes, pain on eye movement, blurry and double vision and new right ptosis. No predilection for time of day.   Imaging and Labwork:  MR Brain and Orbits 12/29/2014: Personal examination of MRI or the orbits may show enhancing soft tissue mass at the left orbital apex, possibly suggesting tolosa hunt. Formal report: 1. 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 2. No other focal lesion of the orbits to explain diplopia. 3. Mild periventricular and subcortical T2 changes are slightly advanced for age. The finding is nonspecific but can be seen in the setting of chronic microvascular ischemia, a demyelinating process such as multiple sclerosis, vasculitis, complicated migraine headaches, or as the sequelae of a prior infectious or inflammatory process. 4. No focal pathology within the occipital cortex.  MRV 01/13/2015: Normal MRV head (without).  MRI of the brain and orbits 11/08/2015:  1. Asymmetry of the cavernous sinuses due to enhancing soft tissue surrounding the right internal carotid artery. 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 This has occurred since the MRI dated 12/29/2014. At that time, there  appeared to be similar changes involving the left cavernous sinus. 2. A few scattered T2/FLAIR hyperintense foci consistent with mild chronic  microvascular ischemic change. This appears to have progressed slightly when compared to the prior MRI.  Serum labs unremarkable: TSH, crp, sed rate, cbc, cmp, RF, ANA, RPR, CK, AchR antibodies, NMO, Lyme, pan-anca, IFE, paraneoplastic panel, B12, folate, HIV, Ace  CSF unremarkable: cytology,IGG index, VDRL, oligoclonal bands, ace, , glucose, cell count and diff, tuberculosis, fungus, gram stain, culture.  CSF mild increased protein 62   Review of Systems: Patient complains of symptoms per HPI as well as the following symptoms: headache, no CP, no SOB, no Fever, no recent illnesses, no recent travel. Pertinent negatives per HPI. All others negative.   Social History   Social History  . Marital Status: Married    Spouse Name: N/A  . Number of Children: 4  . Years of Education: Bachelor's   Occupational History  . [place] History Main Topics  . Smoking status: Former Smoker -- 10 years    Types: Cigarettes    Quit date: 09/25/1976  . Smokeless tobacco: Not on file  . Alcohol Use: 0.0 oz/week    0 Standard drinks or equivalent per week     Comment: Once per month  . Drug Use: No  . Sexual Activity: Not on file   Other Topics Concern  . Not on file   Social History Narrative   Lives at home with wife.   Right handed.   Caffeine use: 1 cup coffee per day.   Occasionally drinks soda and/or tea     Family History  Problem Relation Age of Onset  . Glaucoma Mother   . Glaucoma Father   . Pancreatic cancer Mother     Past Medical History  Diagnosis Date  . Hypertension   . Arthritis   . Anxiety   . Elevated cholesterol   . Glaucoma     right worse than left  . High cholesterol   . Back pain     Fragments in vertebrae     Past Surgical History  Procedure Laterality Date  . Knee arthroscopy Left 1984    Current Outpatient Prescriptions  Medication Sig Dispense Refill  . ALPRAZolam (XANAX) 0.25 MG tablet Take 1-2 tablets before the  lumbar puncture and also before the MRI of the brain 15 tablet 0  . aspirin 81 MG tablet Take 81 mg by mouth daily.    Marland Kitchen atenolol (TENORMIN) 25 MG tablet Take 25 mg by mouth daily.    . benzonatate (TESSALON) 100 MG capsule Take 1 capsule (100 mg total) by mouth every 8 (eight) hours. 21 capsule 0  . co-enzyme Q-10 30 MG capsule Take 30 mg by mouth daily as needed (with joint pain).    . dorzolamide-timolol (COSOPT) 22.3-6.8 MG/ML ophthalmic solution Place 1 drop into both eyes 2 (two) times daily.    Marland Kitchen ezetimibe (ZETIA) 10 MG tablet Take 10 mg by mouth every evening.    Marland Kitchen HYDROcodone-acetaminophen (NORCO/VICODIN) 5-325 MG per tablet Take 1 tablet by mouth as needed.  0  . HYDROcodone-homatropine (HYCODAN) 5-1.5 MG/5ML syrup Take 5 mLs by mouth every 6 (six) hours as needed for cough. 120 mL 0  . ibuprofen (ADVIL,MOTRIN) 200 MG tablet Take 400 mg by mouth every 6 (six) hours as needed for headache.    . LUMIGAN 0.01 % SOLN Place 1 drop into both eyes daily. At bedtime    .  Multiple Vitamin (MULTIVITAMIN WITH MINERALS) TABS tablet Take 1 tablet by mouth daily.    . Omega-3 Fatty Acids (FISH OIL) 1200 MG CAPS Take 1 capsule by mouth daily.    . predniSONE (DELTASONE) 10 MG tablet Start with 24m daily and decrease by 1 pill every week. 150 tablet 0  . traMADol (ULTRAM) 50 MG tablet Take 1 tablet (50 mg total) by mouth every 6 (six) hours as needed. 60 tablet 1   No current facility-administered medications for this visit.    Allergies as of 02/08/2016 - Review Complete 02/08/2016  Allergen Reaction Noted  . Alphagan p [brimonidine tartrate] Other (See Comments) 12/29/2014  . Lipitor [atorvastatin] Other (See Comments) 01/04/2015    Vitals: BP 130/76 mmHg  Pulse 70  Ht 5' 11"  (1.803 m)  Wt 187 lb 6.4 oz (85.004 kg)  BMI 26.15 kg/m2 Last Weight:  Wt Readings from Last 1 Encounters:  02/08/16 187 lb 6.4 oz (85.004 kg)   Last Height:   Ht Readings from Last 1 Encounters:  02/08/16 5'  11" (1.803 m)    Physical exam: Exam: Gen: NAD               CV: RRR, no MRG. No Carotid Bruits. No peripheral edema, warm, nontender Eyes: Conjunctivae clear without exudates or hemorrhage  Neuro: Detailed Neurologic Exam  Speech:    Speech is normal; fluent and spontaneous with normal comprehension.  Cognition:    The patient is oriented to person, place, and time;     recent and remote memory intact;     language fluent;     normal attention, concentration,     fund of knowledge Cranial Nerves:  (Chronic right eye glaucoma) Right pupil unreactive and 2-366m right optic nerve pallor and atrophy, right APD(all chronic due to Glaucoma). Left pupil pinpoint. 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 Decreased corneal reflex right eye. otherwise EOMI. Visual fields are limited in the right eye due to third nerve palsy and severe chronic glaucoma. VF intact left eye. Trigeminal face sensation is intact and the muscles of mastication are normal. The face is otherwise symmetric. The palate elevates in the midline. Hearing intact. Voice is normal. Shoulder shrug is normal. The tongue has normal motion without fasciculations.   Coordination:    Normal finger to nose and heel to shin. Normal rapid alternating movements.   Gait:    Heel-toe and tandem gait are normal.   Motor Observation:    No asymmetry, no atrophy, and no involuntary movements noted. Tone:    Normal muscle tone.    Posture:    Posture is normal. normal erect    Strength:    Strength is V/V in the upper and lower limbs.      Sensation: intact to LT    Assessment/Plan: ElSALMAAN PATCHINs a lovely [age].o. male here as a referral from Dr. ShBrigitte Pulseor repeated episodes of painful ophthalmoplegia resolved with high dose steroids suspicious of Tolosa Hunt Syndrome. 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 Episodes in the past resolved with steroids.Review of  MRI or the orbits have shown enhancing soft tissue mass at the orbital apex unilateral to symptoms, suggesting tolosa hunt.  Extensive workup, imaging, labs, csf as above.   - Started oral prednisone again 60107maily. Will taper by 48m86mery 2 weeks. Can consider immunosuppressant possibly remicade if patient continues to have episodes. - Need referral to  [facility] Dr. Sanda Klein for second opinion. Would prefer asap while patient is currently symptomatic.     Sarina Ill, MD  [facility] [address] [place], [place] [address]  Phone [phone] Fax [phone]  A total of 30 minutes was spent face-to-face with this patient. Over half this time was spent on counseling patient on the [name] Diagnosis diagnosis and different diagnostic and therapeutic options available.

## 2016-02-09 ENCOUNTER — Encounter: Payer: Self-pay | Admitting: Neurology

## 2016-02-10 ENCOUNTER — Ambulatory Visit: Payer: BLUE CROSS/BLUE SHIELD | Admitting: Neurology

## 2016-03-02 ENCOUNTER — Telehealth: Payer: Self-pay | Admitting: Neurology

## 2016-03-02 NOTE — Telephone Encounter (Addendum)
Todd [name]/[facility] requested last OV faxed to [phone]. Operator faxed OV .

## 2016-03-14 ENCOUNTER — Ambulatory Visit (INDEPENDENT_AMBULATORY_CARE_PROVIDER_SITE_OTHER): Payer: BLUE CROSS/BLUE SHIELD | Admitting: Neurology

## 2016-03-14 VITALS — BP 138/77 | HR 83 | Ht 71.0 in | Wt 190.8 lb

## 2016-03-14 DIAGNOSIS — H4943 Progressive external ophthalmoplegia, bilateral: Secondary | ICD-10-CM

## 2016-03-14 DIAGNOSIS — I82601 Acute embolism and thrombosis of unspecified veins of right upper extremity: Secondary | ICD-10-CM | POA: Diagnosis not present

## 2016-03-14 MED ORDER — PREDNISONE 5 MG PO TABS: 20.0000 mg | ORAL_TABLET | Freq: Every day | ORAL | Status: DC

## 2016-03-14 NOTE — Patient Instructions (Signed)
Weeks 1-2: 20mg  (4 pills) [id number]: 15mg  (3 pills) [id number]: 10mg  (2 pills) Week 7-8: 7.5mg  (1.5 pills) Week 9-10: 5mg  (1 pill) Week 11-12: 2.5 mg and hold (1/2 pill)

## 2016-03-14 NOTE — Progress Notes (Signed)
Todd [name]    Provider: Dr Todd [name] Referring Provider: Mayra Neer, MD Primary Care Physician: Mayra Neer, MD  CC: Headache, double vision, progressive and worsening  HPI: Todd [name] is a 65 y.o. male here as a referral from Dr. Brigitte Pulse for repeated episodes of painful ophthalmoplegia resolved with high dose steroids suspicious of Tolosa Hunt Syndrome.   Patient is a lovely 65 year old gentleman who is here for another episode of diplopia this time with right eye ptosis as well. 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 He was originally seen by me in April 2016 for diplopia and left-sided eye pain of unknown etiology. He was having pain on the left side of the head, pain on eye movement, headache, cramping to the middle of the head without light sensitivity, no sound sensitivity, no nausea, no vomiting He was evaluated inpatient and MRIs(MRI brain, MRI orbits, MRV) and labs(esr,crp) did not reveal etiology. 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 Further re-examination of MRI imaging of the orbits showed enhancing soft tissue mass at the left orbital apex, possibly suggesting tolosa hunt. The symptoms resolved with a course of high-dose steroids.  Patient returned in October 2016 with similar problems in the opposite eye, the right eye. He described pain in the back of the right eye, right temple cramping pain, double vision, side by side, with both eyes open, pain on eye movement. He had throbbing pain behind the right eye. 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 MRI of the orbits and brain now showed "asymmetry of the cavernous sinuses due to enhancing soft tissue surrounding the right internal carotid artery. 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 This has occurred since the MRI dated 12/29/2014. At that time, there appeared to be similar changes involving the  left cavernous sinus." Patient's symptoms again resolved with high-dose steroids. 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 Onset 10 days ago and progressive with vision changes(diplopia).Patient cannot afford anymore imaging. He describes the same symptoms of headaches, pressure behind the eyes, pain on eye movement, blurry and double vision and new right ptosis. No predilection for time of day.   Interval history 03/14/2016: Patient has been on a tapering course of steroids since last being seen. Started at 38m and last dose was a week ago and now he is starting to have the headache again. Will restart at 231mand taper more slowly. Today the 3rd nerve palsy is reolved.   Imaging and Labwork:  MR Brain and Orbits 12/29/2014: Personal examination of MRI or the orbits may show enhancing soft tissue mass at the left orbital apex, possibly suggesting tolosa hunt. Formal report: 1. 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 2. No other focal lesion of the orbits to explain diplopia. 3. Mild periventricular and subcortical T2 changes are slightly advanced for age. The finding is nonspecific but can be seen in the setting of chronic microvascular ischemia, a demyelinating process such as multiple sclerosis, vasculitis, complicated migraine headaches, or as the sequelae of a prior infectious or inflammatory process. 4. No focal pathology within the occipital cortex.  MRV 01/13/2015: Normal MRV head (without).  MRI  of the brain and orbits 11/08/2015: 1. Asymmetry of the cavernous sinuses due to enhancing soft tissue surrounding the right internal carotid artery. Combined with his clinical presentation, this is  consistent with [name] syndrome. This has occurred since the MRI dated 12/29/2014. At that time, there appeared to be similar changes involving the left cavernous sinus. 2. A few scattered T2/FLAIR hyperintense foci consistent with mild chronic microvascular ischemic change. This appears to have progressed slightly when compared to the prior MRI.  Serum labs unremarkable: TSH, crp, sed rate, cbc, cmp, RF, ANA, RPR, CK, AchR antibodies, NMO, Lyme, pan-anca, IFE, paraneoplastic panel, B12, folate, HIV, Ace  CSF unremarkable: cytology,IGG index, VDRL, oligoclonal bands, ace, , glucose, cell count and diff, tuberculosis, fungus, gram stain, culture.  CSF mild increased protein 62.   Social History   Social History  . Marital Status: Married    Spouse Name: N/A  . Number of Children: 4  . Years of Education: Bachelor's   Occupational History  . [place] History Main Topics  . Smoking status: Former Smoker -- 10 years    Types: Cigarettes    Quit date: 09/25/1976  . Smokeless tobacco: Not on file  . Alcohol Use: 0.0 oz/week    0 Standard drinks or equivalent per week     Comment: Once per month  . Drug Use: No  . Sexual Activity: Not on file   Other Topics Concern  . Not on file   Social History Narrative   Lives at home with wife.   Right handed.   Caffeine use: 1 cup coffee per day.   Occasionally drinks soda and/or tea     Family History  Problem Relation Age of Onset  . Glaucoma Mother   . Glaucoma Father   . Pancreatic cancer Mother     Past Medical History  Diagnosis Date  . Hypertension   . Arthritis   . Anxiety   . Elevated cholesterol   . Glaucoma     right worse than left  . High cholesterol   . Back pain     Fragments in vertebrae     Past Surgical History  Procedure Laterality Date  . Knee arthroscopy Left 1984    Current Outpatient Prescriptions  Medication Sig Dispense Refill  . ALPRAZolam (XANAX) 0.25 MG  tablet Take 1-2 tablets before the lumbar puncture and also before the MRI of the brain 15 tablet 0  . aspirin 81 MG tablet Take 81 mg by mouth daily.    Marland Kitchen atenolol (TENORMIN) 25 MG tablet Take 25 mg by mouth daily.    . benzonatate (TESSALON) 100 MG capsule Take 1 capsule (100 mg total) by mouth every 8 (eight) hours. 21 capsule 0  . co-enzyme Q-10 30 MG capsule Take 30 mg by mouth daily as needed (with joint pain).    . dorzolamide-timolol (COSOPT) 22.3-6.8 MG/ML ophthalmic solution Place 1 drop into both eyes 2 (two) times daily.    Marland Kitchen ezetimibe (ZETIA) 10 MG tablet Take 10 mg by mouth every evening.    Marland Kitchen HYDROcodone-acetaminophen (NORCO/VICODIN) 5-325 MG per tablet Take 1 tablet by mouth as needed.  0  . HYDROcodone-homatropine (HYCODAN) 5-1.5 MG/5ML syrup Take 5 mLs by mouth every 6 (six) hours as needed for cough. 120 mL 0  . ibuprofen (ADVIL,MOTRIN) 200 MG tablet Take 400 mg by mouth every 6 (six) hours as needed for headache.    . LUMIGAN 0.01 % SOLN Place 1 drop  into both eyes daily. At bedtime    . Multiple Vitamin (MULTIVITAMIN WITH MINERALS) TABS tablet Take 1 tablet by mouth daily.    . Omega-3 Fatty Acids (FISH OIL) 1200 MG CAPS Take 1 capsule by mouth daily.    . predniSONE (DELTASONE) 20 MG tablet Take 3 tablets (60 mg total) by mouth daily. 90 tablet 3  . traMADol (ULTRAM) 50 MG tablet Take 1 tablet (50 mg total) by mouth every 6 (six) hours as needed. 60 tablet 1   No current facility-administered medications for this visit.    Allergies as of 03/14/2016 - Review Complete 02/08/2016  Allergen Reaction Noted  . Alphagan p [brimonidine tartrate] Other (See Comments) 12/29/2014  . Lipitor [atorvastatin] Other (See Comments) 01/04/2015    Vitals: There were no vitals taken for this visit. Last Weight:  Wt Readings from Last 1 Encounters:  02/08/16 187 lb 6.4 oz (85.004 kg)   Last Height:   Ht Readings from Last 1 Encounters:  02/08/16 5' 11"  (1.803 m)     Neuro: Detailed Neurologic Exam  Speech:  Speech is normal; fluent and spontaneous with normal comprehension.  Cognition:  The patient is oriented to person, place, and time;   recent and remote memory intact;   language fluent;   normal attention, concentration,   fund of knowledge Cranial Nerves:  (Chronic right eye glaucoma) Right pupil unreactive and 2-9m, right optic nerve pallor and atrophy, right APD(all chronic due to Glaucoma). Left pupil pinpoint. 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 Decreased corneal reflex right eye. otherwise EOMI. Visual fields are limited in the right eye due to third nerve palsy and severe chronic glaucoma. VF intact left eye. Trigeminal face sensation is intact and the muscles of mastication are normal. The face is otherwise symmetric. The palate elevates in the midline. Hearing intact. Voice is normal. Shoulder shrug is normal. The tongue has normal motion without fasciculations.   Coordination:  Normal finger to nose and heel to shin. Normal rapid alternating movements.   Gait:  Heel-toe and tandem gait are normal.   Motor Observation:  No asymmetry, no atrophy, and no involuntary movements noted. Tone:  Normal muscle tone.   Posture:  Posture is normal. normal erect   Strength:  Strength is V/V in the upper and lower limbs.    Sensation: intact to LT    Assessment/Plan: EAMANUEL SINKFIELDis a lovely 65y.o. male here as a referral from Dr. SBrigitte Pulsefor repeated episodes of painful ophthalmoplegia resolved with high dose steroids suspicious of Tolosa Hunt Syndrome. 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 Episodes in the past resolved with steroids.Review of MRI or the orbits have shown enhancing soft tissue mass at the orbital apex unilateral to symptoms, suggesting tolosa hunt. Extensive workup, imaging, labs, csf as above.   -Patient  improved on steroids but headache has returned. We'll restart lower dose steroid.  - Started oral prednisone again 286mdaily. Will taper by 2m87mvery 2 weeks until at 56m74men will decrease every 2 weeks by 2.5. Can consider immunosuppressant possibly remicade if patient continues to have episodes. - [facility] Sanda Klein second opinion.  - Will order an US oKoreathe right upper extremity to eval for arm pain and thrombosis   AntoSarina Ill  [name] [address] [id number] [phone] of  30 minutes was spent face-to-face with this patient. Over half this time was spent on counseling patient on the [name] Diagnosis diagnosis and different diagnostic and therapeutic options available.

## 2016-03-15 ENCOUNTER — Encounter: Payer: Self-pay | Admitting: Neurology

## 2016-03-15 ENCOUNTER — Ambulatory Visit
Admission: RE | Admit: 2016-03-15 | Discharge: 2016-03-15 | Disposition: A | Discharge status: Home / Self Care | Payer: BLUE CROSS/BLUE SHIELD | Source: Ambulatory Visit | Attending: Neurology | Admitting: Neurology

## 2016-03-15 DIAGNOSIS — I82601 Acute embolism and thrombosis of unspecified veins of right upper extremity: Secondary | ICD-10-CM

## 2016-04-03 ENCOUNTER — Telehealth: Payer: Self-pay | Admitting: Neurology

## 2016-04-03 NOTE — Telephone Encounter (Signed)
Patient called requesting to speak with Dr. Jaynee Eagles regarding test he had at [facility].

## 2016-04-03 NOTE — Telephone Encounter (Signed)
Dr Jacalyn Lefevre call pt Called pt back. He states he is worried. At his last visit with Dr Sanda Klein last week, he referred to another Dr., a cancer specialist. H was supposed to see on Dr Hassell Done again 7/17. However when he called, he already was scheduled for a visit for 7/28 with Dr Tempie Donning at the cancer center. Patient stated Dr Hassell Done looked at all the imaging and he would have to speak to his other colleagues about imaging. Patient wants to know if she received notes from Dr Hassell Done and wants to discuss with Dr Jaynee Eagles. Advised I will give her the message. He requested to be called after 4pm if it won't be until tomorrow.

## 2016-04-04 NOTE — Telephone Encounter (Signed)
Spoke with patient, reassured him they need to ensure these are ruled out. We did already test him for these causes and they were negative but I encouraged him to proceed for further evaluation as sometimes we need to test multiple times to get an answer.

## 2016-06-11 ENCOUNTER — Telehealth: Payer: Self-pay | Admitting: Neurology

## 2016-06-11 NOTE — Telephone Encounter (Signed)
Todd [name], can you call patient and ensure he has a follow up appointment with me in the next 4-6 weeks please? Need to discuss how he is doing and if he is still taking steroids we need to taper them off and discuss next steps.

## 2016-06-12 NOTE — Telephone Encounter (Signed)
LVM for pt to call and schedule f/u in the next 4-6 weeks per Dr Jaynee Eagles. Relayed Dr Cathren Laine message.  **Please schedule if patient calls, thank you

## 2016-06-13 NOTE — Telephone Encounter (Signed)
LVM on cell and home number for patient to call and schedule a f/u in 4-6 weeks with Dr Jaynee Eagles. Please schedule if he calls, thank you

## 2016-06-14 NOTE — Telephone Encounter (Signed)
Dr Jaynee Eagles- I have left several messages for this pt. I have not heard back. What would you like to do?

## 2016-06-15 NOTE — Telephone Encounter (Signed)
That is fine he will call when he can thanks

## 2016-07-12 ENCOUNTER — Ambulatory Visit: Payer: BLUE CROSS/BLUE SHIELD | Admitting: Neurology

## 2016-07-18 ENCOUNTER — Ambulatory Visit (INDEPENDENT_AMBULATORY_CARE_PROVIDER_SITE_OTHER): Payer: BLUE CROSS/BLUE SHIELD | Admitting: Neurology

## 2016-07-18 ENCOUNTER — Encounter: Payer: Self-pay | Admitting: Neurology

## 2016-07-18 ENCOUNTER — Encounter: Payer: Self-pay | Admitting: *Deleted

## 2016-07-18 DIAGNOSIS — H499 Unspecified paralytic strabismus: Secondary | ICD-10-CM | POA: Insufficient documentation

## 2016-07-18 DIAGNOSIS — H4940 Progressive external ophthalmoplegia, unspecified eye: Secondary | ICD-10-CM | POA: Insufficient documentation

## 2016-07-18 DIAGNOSIS — H0510 Unspecified chronic inflammatory disorders of orbit: Secondary | ICD-10-CM

## 2016-07-18 MED ORDER — PREDNISONE 2.5 MG PO TABS: 2.5000 mg | ORAL_TABLET | ORAL | 11 refills | Status: DC

## 2016-07-18 NOTE — Progress Notes (Addendum)
[facility]    Provider:  Dr Jaynee Eagles Referring Provider: Mayra Neer, MD Primary Care Physician:  Mayra Neer, MD  CC: Headache, double vision, progressive and worsening.   Interval history 07/18/2016: Patient was evaluated at [facility] by neuro-ophthalmology as well as hematology oncology. There is no other cause found for his orbital inflammation, diagnosed with noninfectious orbital inflammation or [name] syndrome. Patient has been in multiple rounds of steroids. Suggest steroid sparing agents given the side effects of long-term steroid use. Rituximab, cyclophosphamide, methotrexate and CellCept or some of the agents that have been used successfully in [name] syndrome.He is on prednisone 60m every other day. Discussed steroid sparing agents. He declines at this time, would prefer to stay on low-dose steroids. Will try 2.551mevery other day. Steroids can worsen glaucoma so need to follow up with ophthalmologist and let him know about the steroids and discuss wit him. Discussed side effects of long-term steroids.    MRI [facility] 04/2016:  BRAIN Calvarium/skull base: No focal marrow replacing lesion suggestive of neoplasm. Paranasal sinuses: Imaged portions clear. Brain: Previously seen ill-defined enhancing tissue within the bilateral cavernous sinuses has essentially resolved and is favored to represent response to therapy in this patient with a diagnosis of [name] syndrome. Mild asymmetric prominence of the right 3rd cranial nerve may reflect residual inflammation. Similar atrophy of the right optic nerve. No evidence of an acute abnormality. No significant white matter disease or acute ischemia. No mass effect, hemorrhage, or hydrocephalus. No abnormal enhancement to suggest neoplasm, abscess, or mass lesion. Grossly normal flow voids in the major intracranial arteries and dural venous sinuses. Additional comments: Multilevel spondylosis of the imaged  cervical spine.  HPI: Todd CWIKLAs a 65 yo. male male here as a referral from Dr. ShBrigitte Pulseor repeated episodes of painful ophthalmoplegia resolved with high dose steroids suspicious of Tolosa Hunt Syndrome.   Patient is a lovely [age]ear old gentleman who is here for another episode of diplopia this time with right eye ptosis as well. 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 He was originally seen by me in April 2016 for diplopia and left-sided eye pain of unknown etiology. He was having pain on the left side of the head, pain on eye movement, headache, cramping to the middle of the head without light sensitivity, no sound sensitivity, no nausea, no vomiting He was evaluated inpatient and MRIs(MRI brain, MRI orbits, MRV) and labs(esr,crp) did not reveal etiology. 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 Further re-examination of MRI imaging of the orbits showed enhancing soft tissue mass at the left orbital apex, possibly suggesting tolosa hunt. The symptoms resolved with a course of high-dose steroids.  Patient returned in October 2016 with similar problems in the opposite eye, the right eye. He described pain in the back of the right eye, right temple cramping pain, double vision, side by side, with both eyes open, pain on eye movement. He had throbbing pain behind the right eye. 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 MRI of the orbits and brain now showed "asymmetry of the cavernous sinuses due to enhancing soft tissue surrounding the right internal carotid artery. 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 This has occurred since the MRI dated 12/29/2014. At that time, there appeared to be similar changes involving the left cavernous sinus." Patient's symptoms again resolved with high-dose steroids. 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 Onset 10 days ago and progressive with vision changes(diplopia).Patient cannot afford anymore imaging. He describes the same symptoms of headaches, pressure behind the eyes, pain on eye movement, blurry and double vision and new right ptosis. No predilection for time of day.   Interval history 03/14/2016: Patient has been on a tapering course of steroids since last being seen. Started at 37m and last dose was a week ago and now he is starting to have the headache again. Will restart at 236mand taper more slowly. Today the 3rd nerve palsy is reolved.   Imaging and Labwork:  MR Brain and Orbits 12/29/2014: Personal examination of MRI or the orbits may show enhancing soft tissue mass at the left orbital apex, possibly suggesting tolosa hunt. Formal report: 1. 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 2. No other focal lesion of the orbits to explain diplopia. 3. Mild periventricular and subcortical T2 changes are slightly advanced for age. The finding is nonspecific but can be seen in the setting of chronic microvascular ischemia, a demyelinating process such as multiple sclerosis, vasculitis, complicated migraine headaches, or as the sequelae of a prior infectious or inflammatory process. 4. No focal pathology within the occipital cortex.  MRV 01/13/2015: Normal MRV head (without).  MRI of the brain and orbits 11/08/2015: 1. Asymmetry of the cavernous sinuses due to enhancing soft tissue surrounding the right internal carotid artery. 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 This has occurred since the MRI dated 12/29/2014. At that time, there appeared to be similar changes involving the left cavernous  sinus. 2. A few scattered T2/FLAIR hyperintense foci consistent with mild chronic microvascular ischemic change. This appears to have progressed slightly when compared to the prior MRI.  Serum labs unremarkable: TSH, crp, sed rate, cbc, cmp, RF, ANA, RPR, CK, AchR antibodies, NMO, Lyme, pan-anca, IFE, paraneoplastic panel, B12, folate, HIV, Ace  CSF unremarkable: cytology,IGG index, VDRL, oligoclonal bands, ace, , glucose, cell count and diff, tuberculosis, fungus, gram stain, culture.  CSF mild increased protein 62.   Social History   Social History  . Marital status: Married    Spouse name: N/A  . Number of children: 4  . Years of education: Bachelor's   Occupational History  . [employer]  . Smoking status: Former Smoker    Years: 10.00    Types: Cigarettes    Quit date: 09/25/1976  . Smokeless tobacco: Never Used  . Alcohol use 0.0 oz/week     Comment: Once per month  . Drug use: No  . Sexual activity: Not on file   Other Topics Concern  . Not on file   Social History Narrative   Lives at home with wife.   Right handed.   Caffeine use: 1 cup coffee per day.   Occasionally drinks soda and/or tea     Family History  Problem Relation Age of Onset  . Glaucoma Mother   . Pancreatic cancer Mother   . Glaucoma Father     Past Medical History:  Diagnosis Date  . Anxiety   . Arthritis   . Back pain    Fragments in vertebrae   . Elevated cholesterol   .  Glaucoma    right worse than left  . High cholesterol   . Hypertension     Past Surgical History:  Procedure Laterality Date  . KNEE ARTHROSCOPY Left 1984    Current Outpatient Prescriptions  Medication Sig Dispense Refill  . ALPRAZolam (XANAX) 0.25 MG tablet Take 1-2 tablets before the lumbar puncture and also before the MRI of the brain 15 tablet 0  . aspirin 81 MG tablet Take 81 mg by mouth daily.    Marland Kitchen atenolol (TENORMIN) 25 MG tablet Take 25 mg by mouth  daily.    . benzonatate (TESSALON) 100 MG capsule Take 1 capsule (100 mg total) by mouth every 8 (eight) hours. 21 capsule 0  . co-enzyme Q-10 30 MG capsule Take 30 mg by mouth daily as needed (with joint pain).    . dorzolamide-timolol (COSOPT) 22.3-6.8 MG/ML ophthalmic solution Place 1 drop into both eyes 2 (two) times daily.    Marland Kitchen ezetimibe (ZETIA) 10 MG tablet Take 10 mg by mouth every evening.    Marland Kitchen HYDROcodone-acetaminophen (NORCO/VICODIN) 5-325 MG per tablet Take 1 tablet by mouth as needed.  0  . HYDROcodone-homatropine (HYCODAN) 5-1.5 MG/5ML syrup Take 5 mLs by mouth every 6 (six) hours as needed for cough. 120 mL 0  . ibuprofen (ADVIL,MOTRIN) 200 MG tablet Take 400 mg by mouth every 6 (six) hours as needed for headache.    . LUMIGAN 0.01 % SOLN Place 1 drop into both eyes daily. At bedtime    . Multiple Vitamin (MULTIVITAMIN WITH MINERALS) TABS tablet Take 1 tablet by mouth daily.    . Omega-3 Fatty Acids (FISH OIL) 1200 MG CAPS Take 1 capsule by mouth daily.    . predniSONE (DELTASONE) 5 MG tablet Take 4 tablets (20 mg total) by mouth daily with breakfast. 120 tablet 3  . traMADol (ULTRAM) 50 MG tablet Take 1 tablet (50 mg total) by mouth every 6 (six) hours as needed. 60 tablet 1   No current facility-administered medications for this visit.     Allergies as of 07/18/2016 - Review Complete 07/18/2016  Allergen Reaction Noted  . Alphagan p [brimonidine tartrate] Other (See Comments) 12/29/2014  . Lipitor [atorvastatin] Other (See Comments) 01/04/2015    Vitals: BP 117/71 (BP Location: Right Arm, Patient Position: Sitting, Cuff Size: Normal)   Pulse 72   Ht _0  (1.803 m)   Wt 195 lb 8 oz (88.7 kg)   BMI 27.27 kg/m  Last Weight:  Wt Readings from Last 1 Encounters:  07/18/16 195 lb 8 oz (88.7 kg)   Last Height:   Ht Readings from Last 1 Encounters:  07/18/16 _1  (1.803 m)    Neuro: Detailed Neurologic Exam  Speech:  Speech is normal; fluent and spontaneous  with normal comprehension.  Cognition:  The patient is oriented to person, place, and time;   recent and remote memory intact;   language fluent;   normal attention, concentration,   fund of knowledge Cranial Nerves:  (Chronic right eye glaucoma) Right pupil unreactive and 2-11m, right optic nerve pallor and atrophy, right APD(all chronic due to Glaucoma). Left pupil pinpoint. 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 Decreased corneal reflex right eye. Visual fields are limited in the right eye due to severe chronic glaucoma. VF intact left eye. Trigeminal face sensation is intact and the muscles of mastication are normal. The face is otherwise symmetric. The palate elevates in the midline. Hearing intact. Voice is normal. Shoulder shrug is normal. The tongue has normal  motion without fasciculations.   Coordination:  Normal finger to nose and heel to shin. Normal rapid alternating movements.   Gait:  Heel-toe and tandem gait are normal.   Motor Observation:  No asymmetry, no atrophy, and no involuntary movements noted. Tone:  Normal muscle tone.   Posture:  Posture is normal. normal erect   Strength:  Strength is V/V in the upper and lower limbs.    Sensation: intact to LT    Assessment/Plan: DARRIE MACMILLAN is a lovely 65 y.o. male here as a referral from Dr. Brigitte Pulse for repeated episodes of painful ophthalmoplegia resolved with high dose steroids suspicious of Tolosa Hunt Syndrome. Patient has had left and right eye 6th nerve palsies and last visit returns with w right 3rd nerve palsy. Episodes in the past resolved with steroids.Review of MRI or the orbits have shown enhancing soft tissue mass at the orbital apex unilateral to symptoms, suggesting tolosa hunt. Extensive workup, imaging, labs, csf as above.   -Patient improved on steroids but headache returned. We'll continue low-dose steroids 2.52m every other day.  Discussed  steroid sparing agents. He declines at this time, would prefer to stay on low-dose steroids. Will try 2.542mevery other day. Steroids can worsen glaucoma so need to follow up with ophthalmologist and let him know about the steroids and discuss wit him. Discussed side effects of long-term steroids. Calcium supplementation.   [facility] concurs with diagnosis  Cc: [address] [phone] IllMD  [facility] [id number] [address]Phone [phone] total of 30 minutes was spent face-to-face with this patient. Over half this time was spent on counseling patient on the ToLisette Abuiagnosis diagnosis and different diagnostic and therapeutic options available.

## 2016-07-18 NOTE — Progress Notes (Signed)
Faxed Dr Cathren Laine completed office note from today to [facility] care center per patient request. Fax: [phone]. Received confirmation. I had pt sign record release form giving permission to fax to their office. Sent to medical records to be scanned.

## 2016-07-18 NOTE — Patient Instructions (Signed)
Remember to drink plenty of fluid, eat healthy meals and do not skip any meals. Try to eat protein with a every meal and eat a healthy snack such as fruit or nuts in between meals. Try to keep a regular sleep-wake schedule and try to exercise daily, particularly in the form of walking, 20-30 minutes a day, if you can.   As far as your medications are concerned, I would like to suggest: prednisone 2.5mg  every other day. Recommend 1200mg  daily in divided doses with Vitamin D daily.   I would like to see you back in 6 months, sooner if we need to. Please call us with any interim questions, concerns, problems, updates or refill requests.   Our phone number is [phone]. We also have an after hours call service for urgent matters and there is a physician on-call for urgent questions. For any emergencies you know to call 911 or go to the nearest emergency room   Prednisone tablets What is this medicine? PREDNISONE (PRED ni sone) is a corticosteroid. It is commonly used to treat inflammation of the skin, joints, lungs, and other organs. Common conditions treated include asthma, allergies, and arthritis. It is also used for other conditions, such as blood disorders and diseases of the adrenal glands. This medicine may be used for other purposes; ask your health care provider or pharmacist if you have questions. What should I tell my health care provider before I take this medicine? They need to know if you have any of these conditions: -Cushing's syndrome -diabetes -glaucoma -heart disease -high blood pressure -infection (especially a virus infection such as chickenpox, cold sores, or herpes) -kidney disease -liver disease -mental illness -myasthenia gravis -osteoporosis -seizures -stomach or intestine problems -thyroid disease -an unusual or allergic reaction to lactose, prednisone, other medicines, foods, dyes, or preservatives -pregnant or trying to get pregnant -breast-feeding How  should I use this medicine? Take this medicine by mouth with a glass of water. Follow the directions on the prescription label. Take this medicine with food. If you are taking this medicine once a day, take it in the morning. Do not take more medicine than you are told to take. Do not suddenly stop taking your medicine because you may develop a severe reaction. Your doctor will tell you how much medicine to take. If your doctor wants you to stop the medicine, the dose may be slowly lowered over time to avoid any side effects. Talk to your pediatrician regarding the use of this medicine in children. Special care may be needed. Overdosage: If you think you have taken too much of this medicine contact a poison control center or emergency room at once. NOTE: This medicine is only for you. Do not share this medicine with others. What if I miss a dose? If you miss a dose, take it as soon as you can. If it is almost time for your next dose, talk to your doctor or health care professional. You may need to miss a dose or take an extra dose. Do not take double or extra doses without advice. What may interact with this medicine? Do not take this medicine with any of the following medications: -metyrapone -mifepristone This medicine may also interact with the following medications: -aminoglutethimide -amphotericin B -aspirin and aspirin-like medicines -barbiturates -certain medicines for diabetes, like glipizide or glyburide -cholestyramine -cholinesterase inhibitors -cyclosporine -digoxin -diuretics -ephedrine -male hormones, like estrogens and birth control pills -isoniazid -ketoconazole -NSAIDS, medicines for pain and inflammation, like ibuprofen or naproxen -phenytoin -rifampin -  toxoids -vaccines -warfarin This list may not describe all possible interactions. Give your health care provider a list of all the medicines, herbs, non-prescription drugs, or dietary supplements you use. Also tell  them if you smoke, drink alcohol, or use illegal drugs. Some items may interact with your medicine. What should I watch for while using this medicine? Visit your doctor or health care professional for regular checks on your progress. If you are taking this medicine over a prolonged period, carry an identification card with your name and address, the type and dose of your medicine, and your doctor's name and address. This medicine may increase your risk of getting an infection. Tell your doctor or health care professional if you are around anyone with measles or chickenpox, or if you develop sores or blisters that do not heal properly. If you are going to have surgery, tell your doctor or health care professional that you have taken this medicine within the last twelve months. Ask your doctor or health care professional about your diet. You may need to lower the amount of salt you eat. This medicine may affect blood sugar levels. If you have diabetes, check with your doctor or health care professional before you change your diet or the dose of your diabetic medicine. What side effects may I notice from receiving this medicine? Side effects that you should report to your doctor or health care professional as soon as possible: -allergic reactions like skin rash, itching or hives, swelling of the face, lips, or tongue -changes in emotions or moods -changes in vision -depressed mood -eye pain -fever or chills, cough, sore throat, pain or difficulty passing urine -increased thirst -swelling of ankles, feet Side effects that usually do not require medical attention (report to your doctor or health care professional if they continue or are bothersome): -confusion, excitement, restlessness -headache -nausea, vomiting -skin problems, acne, thin and shiny skin -trouble sleeping -weight gain This list may not describe all possible side effects. Call your doctor for medical advice about side effects. You  may report side effects to FDA at 1-800-FDA-1088. Where should I keep my medicine? Keep out of the reach of children. Store at room temperature between 15 and 30 degrees C (59 and 86 degrees F). Protect from light. Keep container tightly closed. Throw away any unused medicine after the expiration date. NOTE: This sheet is a summary. It may not cover all possible information. If you have questions about this medicine, talk to your doctor, pharmacist, or health care provider.    2016, [employer]vier/[employer] Standard. (2011-04-27 10:57:14)

## 2016-11-20 ENCOUNTER — Ambulatory Visit: Payer: BLUE CROSS/BLUE SHIELD | Admitting: Neurology

## 2016-12-20 ENCOUNTER — Other Ambulatory Visit: Payer: Self-pay | Admitting: Family Medicine

## 2016-12-20 DIAGNOSIS — Z136 Encounter for screening for cardiovascular disorders: Secondary | ICD-10-CM

## 2016-12-20 DIAGNOSIS — Z87891 Personal history of nicotine dependence: Secondary | ICD-10-CM

## 2016-12-28 ENCOUNTER — Ambulatory Visit
Admission: RE | Admit: 2016-12-28 | Discharge: 2016-12-28 | Disposition: A | Discharge status: Home / Self Care | Payer: BLUE CROSS/BLUE SHIELD | Source: Ambulatory Visit | Attending: Family Medicine | Admitting: Family Medicine

## 2016-12-28 DIAGNOSIS — Z87891 Personal history of nicotine dependence: Secondary | ICD-10-CM

## 2016-12-28 DIAGNOSIS — Z136 Encounter for screening for cardiovascular disorders: Secondary | ICD-10-CM

## 2017-01-15 ENCOUNTER — Encounter (INDEPENDENT_AMBULATORY_CARE_PROVIDER_SITE_OTHER): Payer: Self-pay

## 2017-01-15 ENCOUNTER — Ambulatory Visit (INDEPENDENT_AMBULATORY_CARE_PROVIDER_SITE_OTHER): Payer: Medicare Other | Admitting: Neurology

## 2017-01-15 ENCOUNTER — Encounter: Payer: Self-pay | Admitting: Neurology

## 2017-01-15 VITALS — BP 126/76 | HR 66 | Ht 71.0 in | Wt 194.6 lb

## 2017-01-15 DIAGNOSIS — H499 Unspecified paralytic strabismus: Secondary | ICD-10-CM

## 2017-01-15 DIAGNOSIS — H4940 Progressive external ophthalmoplegia, unspecified eye: Secondary | ICD-10-CM

## 2017-01-15 NOTE — Patient Instructions (Signed)
Remember to drink plenty of fluid, eat healthy meals and do not skip any meals. Try to eat protein with a every meal and eat a healthy snack such as fruit or nuts in between meals. Try to keep a regular sleep-wake schedule and try to exercise daily, particularly in the form of walking, 20-30 minutes a day, if you can.   As far as your medications are concerned, I would like to suggest: 2.5mg  prednisone every other day  I would like to see you back in 6 months, sooner if we need to. Please call us with any interim questions, concerns, problems, updates or refill requests.   Our phone number is [phone]. We also have an after hours call service for urgent matters and there is a physician on-call for urgent questions. For any emergencies you know to call 911 or go to the nearest emergency room

## 2017-01-15 NOTE — Progress Notes (Signed)
[facility]    Provider:  Dr Jaynee Eagles Referring Provider: Mayra Neer, MD Primary Care Physician:  Mayra Neer, MD  CC: Todd [name]  Interval history 01/15/2017: He is taking 40m every other day of prednisone. He had another episode of diplopia and he increased his prednisone to 217mfor a week and it improved.He feels numbness on his right side of his face that is variable with tingling in his right ear. That started with the ToLisette AbuWe'll continue low-dose steroids 2.60m30mvery other day.  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 He declines any steroid. He was not taking his steroids consistently when he had the flare. Discussed trying 2.60mg20mery other day and taking it consistently.   Interval history 07/18/2016: [facility] as hemat[facility] oncology. There is no other cause found for his orbital inflammation, diagnosed with noninfectious orbital inflammation or [name] syndrome. Patient has been in multiple rounds of steroids. Suggest steroid sparing agents given the side effects of long-term steroid use. 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 Discussed steroid sparing agents. He declines at this time, would prefer to stay on low-dose steroids. Will try 2.60mg e60my other day. Steroids can worsen glaucoma so need to follow up with ophthalmologist and let him know about the steroids and discuss wit him. Discussed side effects of long-term steroids.    MRI [facility] 04/2016:  BRAIN Calvarium/skull base: No focal marrow replacing lesion suggestive of neoplasm. Paranasal sinuses: Imaged portions clear. 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 Mild asymmetric prominence of the right 3rd cranial nerve may reflect residual inflammation. Similar atrophy of the right optic nerve. No evidence of an acute abnormality. No significant white matter disease or acute ischemia. No mass effect, hemorrhage, or hydrocephalus. No abnormal enhancement to suggest neoplasm, abscess, or mass lesion. Grossly normal flow voids in the major intracranial arteries and dural venous sinuses. Additional comments: Multilevel spondylosis of the imaged cervical spine.  HPI: Todd [id number] here as a referral from Dr. Shaw fBrigitte Pulseepeated episodes of painful ophthalmoplegia resolved with high dose steroids suspicious of Tolosa Hunt Syndrome.   Patient is a lovely 64 yea43old gentleman who is here for another episode of diplopia this time with right eye ptosis as well. 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 He was originally seen by me in April 2016 for diplopia and left-sided eye pain of unknown etiology. He was having pain on the left side of the head, pain on eye movement, headache, cramping to the middle of the head without light sensitivity, no sound sensitivity, no nausea, no vomiting He was evaluated inpatient and MRIs(MRI brain, MRI orbits, MRV) and labs(esr,crp) did not reveal etiology. 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 Further re-examination of MRI imaging of the orbits showed enhancing soft tissue mass at the left orbital apex, possibly suggesting tolosa hunt. The symptoms resolved with a course of high-dose steroids.  Patient returned in October 2016 with similar problems in the opposite eye, the right eye. He described pain in the back of  the right eye, right temple cramping pain, double vision, side by side, with  both eyes open, pain on eye movement. He had throbbing pain behind the right eye. 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 MRI of the orbits and brain now showed "asymmetry of the cavernous sinuses due to enhancing soft tissue surrounding the right internal carotid artery. 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 This has occurred since the MRI dated 12/29/2014. At that time, there appeared to be similar changes involving the left cavernous sinus." Patient's symptoms again resolved with high-dose steroids. 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 Onset 10 days ago and progressive with vision changes(diplopia).Patient cannot afford anymore imaging. He describes the same symptoms of headaches, pressure behind the eyes, pain on eye movement, blurry and double vision and new right ptosis. No predilection for time of day.   Interval history 03/14/2016: Patient has been on a tapering course of steroids since last being seen. Started at 39m and last dose was a week ago and now he is starting to have the headache again. Will restart at 237mand taper more slowly. Today the 3rd nerve palsy is reolved.   Imaging and Labwork:  MR Brain and Orbits 12/29/2014:Personal examination of MRI or the orbits may show enhancing soft tissue mass at the left orbital apex, possibly suggesting tolosa hunt. Formal report: 1. 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 2. No other focal lesion of the orbits to explain diplopia. 3. Mild periventricular and subcortical T2 changes are slightly advanced for age. The finding is nonspecific but can be seen in the setting of chronic microvascular  ischemia, a demyelinating process such as multiple sclerosis, vasculitis, complicated migraine headaches, or as the sequelae of a prior infectious or inflammatory process. 4. No focal pathology within the occipital cortex.  MRV 01/13/2015:Normal MRV head (without).  MRI of the brain and orbits 11/08/2015: 1. Asymmetry of the cavernous sinuses due to enhancing soft tissue surrounding the right internal carotid artery. 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 This has occurred since the MRI dated 12/29/2014. At that time, there appeared to be similar changes involving the left cavernous sinus. 2. A few scattered T2/FLAIR hyperintense foci consistent with mild chronic microvascular ischemic change. This appears to have progressed slightly when compared to the prior MRI.  Serum labs unremarkable: TSH, crp, sed rate, cbc, cmp, RF, ANA, RPR, CK, AchR antibodies, NMO, Lyme, pan-anca, IFE, paraneoplastic panel, B12, folate, HIV, Ace  CSF unremarkable: cytology,IGG index, VDRL, oligoclonal bands, ace, , glucose, cell count and diff, tuberculosis, fungus, gram stain, culture.  CSF mild increased protein 62.   Social History   Social History  . Marital status: Married    Spouse name: N/A  . Number of children: 4  . Years of education: Bachelor's   Occupational History  . [employer]  . Smoking status: Former Smoker    Years: 10.00    Types: Cigarettes    Quit date: 09/25/1976  . Smokeless tobacco: Never Used  . Alcohol use 0.0 oz/week     Comment: Once per month  . Drug use: No  . Sexual activity:  Not on file   Other Topics Concern  . Not on file   Social History Narrative   Lives at home with wife.   Right handed.   Caffeine use: 1 cup coffee per day.   Occasionally drinks soda and/or tea     Family History  Problem Relation Age of Onset  . Glaucoma Mother   . Pancreatic cancer  Mother   . Glaucoma Father     Past Medical History:  Diagnosis Date  . Anxiety   . Arthritis   . Back pain    Fragments in vertebrae   . Elevated cholesterol   . Glaucoma    right worse than left  . High cholesterol   . Hypertension     Past Surgical History:  Procedure Laterality Date  . KNEE ARTHROSCOPY Left 1984    Current Outpatient Prescriptions  Medication Sig Dispense Refill  . ALPRAZolam (XANAX) 0.25 MG tablet Take 1-2 tablets before the lumbar puncture and also before the MRI of the brain 15 tablet 0  . aspirin 81 MG tablet Take 81 mg by mouth daily.    Marland Kitchen atenolol (TENORMIN) 25 MG tablet Take 25 mg by mouth daily.    . benzonatate (TESSALON) 100 MG capsule Take 1 capsule (100 mg total) by mouth every 8 (eight) hours. 21 capsule 0  . co-enzyme Q-10 30 MG capsule Take 30 mg by mouth daily as needed (with joint pain).    . dorzolamide-timolol (COSOPT) 22.3-6.8 MG/ML ophthalmic solution Place 1 drop into both eyes 2 (two) times daily.    Marland Kitchen ezetimibe (ZETIA) 10 MG tablet Take 10 mg by mouth every evening.    Marland Kitchen HYDROcodone-acetaminophen (NORCO/VICODIN) 5-325 MG per tablet Take 1 tablet by mouth as needed.  0  . HYDROcodone-homatropine (HYCODAN) 5-1.5 MG/5ML syrup Take 5 mLs by mouth every 6 (six) hours as needed for cough. 120 mL 0  . ibuprofen (ADVIL,MOTRIN) 200 MG tablet Take 400 mg by mouth every 6 (six) hours as needed for headache.    . LUMIGAN 0.01 % SOLN Place 1 drop into both eyes daily. At bedtime    . Multiple Vitamin (MULTIVITAMIN WITH MINERALS) TABS tablet Take 1 tablet by mouth daily.    . Omega-3 Fatty Acids (FISH OIL) 1200 MG CAPS Take 1 capsule by mouth daily.    . predniSONE (DELTASONE) 2.5 MG tablet Take 1 tablet (2.5 mg total) by mouth every other day. With breakfast 30 tablet 11  . traMADol (ULTRAM) 50 MG tablet Take 1 tablet (50 mg total) by mouth every 6 (six) hours as needed. 60 tablet 1   No current facility-administered medications for this visit.      Allergies as of 01/15/2017 - Review Complete 07/18/2016  Allergen Reaction Noted  . Alphagan p [brimonidine tartrate] Other (See Comments) 12/29/2014  . Lipitor [atorvastatin] Other (See Comments) 01/04/2015    Vitals: There were no vitals taken for this visit. Last Weight:  Wt Readings from Last 1 Encounters:  07/18/16 195 lb 8 oz (88.7 kg)   Last Height:   Ht Readings from Last 1 Encounters:  07/18/16 5' 11"  (1.803 m)    Neuro: Detailed Neurologic Exam  Speech:  Speech is normal; fluent and spontaneous with normal comprehension.  Cognition:  The patient is oriented to person, place, and time;   recent and remote memory intact;   language fluent;   normal attention, concentration,   fund of knowledge Cranial Nerves:  (Chronic right eye glaucoma) Right pupil unreactive and  2-32m, right optic nerve pallor and atrophy, right APD(all chronic due to Glaucoma). Left pupil pinpoint. 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 Decreased corneal reflex right eye. Visual fields are limited in the right eye due to severe chronic glaucoma. VF intact left eye. Trigeminal face sensation is intact and the muscles of mastication are normal. The face is otherwise symmetric. The palate elevates in the midline. Hearing intact. Voice is normal. Shoulder shrug is normal. The tongue has normal motion without fasciculations.   Coordination:  Normal finger to nose and heel to shin. Normal rapid alternating movements.   Gait:  Heel-toe and tandem gait are normal.   Motor Observation:  No asymmetry, no atrophy, and no involuntary movements noted. Tone:  Normal muscle tone.   Posture:  Posture is normal. normal erect   Strength:  Strength is V/V in the upper and lower limbs.    Sensation: intact to LT    Assessment/Plan: Todd NORWOODis a lovely 66y.o. male here as a referral from Dr. SBrigitte Pulsefor repeated episodes of painful  ophthalmoplegia resolved with high dose steroids. 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 Episodes in the past resolved with steroids.Review of MRI or the orbits have shown enhancing soft tissue mass at the orbital apex unilateral to symptoms, suggesting tolosa hunt. Extensive workup, imaging, labs, csf as above.  [facility]. There is no other cause found for his orbital inflammation, diagnosed with noninfectious orbital inflammation or [name] syndrome.    -Patient improved on steroids but headache returned. We'll continue low-dose steroids 515mevery other day. 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 He declines at this time, would prefer to stay on low-dose steroids. Steroids can worsen glaucoma so need to follow up with ophthalmologist and let him know about the steroids and discuss wit him. Discussed side effects of long-term steroids. Calcium supplementation.   [facility], concurs with [facility]: [place] [phone]. ShBrigitte PulseA total of 25 minutes was spent in with this patient. Over half this time was spent on counseling patient on the ToSt. Ignatiusiagnosis and different therapeutic options available.

## 2017-04-03 ENCOUNTER — Telehealth: Payer: Self-pay | Admitting: Neurology

## 2017-04-03 NOTE — Telephone Encounter (Signed)
Pt was last seen for [name] syndrome in April. Prednisone was decreased to 2.5 mg every other day. He declined adding any steroid sparing agent at the time. Pt says that he has been taking the steroid but has still experiencing R eye pressure over the past couple of weeks. His next appt is scheduled in Oct.

## 2017-04-03 NOTE — Telephone Encounter (Signed)
Have him increase the prednisone to 2.5mg  daily and see if the pressure goes away thanks

## 2017-04-03 NOTE — Telephone Encounter (Signed)
Patient has pressure in right eye x 2 weeks. He is taking Prednisone for this. Please call and discuss.

## 2017-04-04 NOTE — Telephone Encounter (Signed)
Returned pt's call to discuss further but no answer. Left VM mssg to call back.

## 2017-04-04 NOTE — Telephone Encounter (Signed)
Patient is returning your call.  

## 2017-04-04 NOTE — Telephone Encounter (Signed)
Pt called back this morning and said that he has been taking up to 10 mg of the prednisone w/o relief.

## 2017-04-04 NOTE — Telephone Encounter (Signed)
Todd [name], let m eknow what he says thanks

## 2017-04-04 NOTE — Telephone Encounter (Signed)
Called pt back again and got VM. Left another mssg requesting that he call back in the morning between 9 am and noon (while Dr. Jaynee Eagles has EMGs).

## 2017-04-05 ENCOUNTER — Other Ambulatory Visit: Payer: Self-pay | Admitting: Neurology

## 2017-04-05 DIAGNOSIS — H499 Unspecified paralytic strabismus: Secondary | ICD-10-CM

## 2017-04-05 DIAGNOSIS — H4940 Progressive external ophthalmoplegia, unspecified eye: Secondary | ICD-10-CM

## 2017-04-05 MED ORDER — LORAZEPAM 0.5 MG PO TABS: 0.5000 mg | ORAL_TABLET | Freq: Three times a day (TID) | ORAL | 0 refills | Status: DC | PRN

## 2017-04-05 MED ORDER — PREDNISONE 10 MG PO TABS: ORAL_TABLET | ORAL | 0 refills | Status: DC

## 2017-04-05 MED ORDER — PREDNISONE 2.5 MG PO TABS: 2.5000 mg | ORAL_TABLET | Freq: Every day | ORAL | 11 refills | Status: DC

## 2017-04-05 NOTE — Telephone Encounter (Signed)
Prednisone refill e-scribed and lorazepam rx printed, signed and faxed to pharmacy. Called pt back and educated him on titration of prednisone and new med including side effects. He verbalized understanding and appreciation for call.

## 2017-04-05 NOTE — Addendum Note (Signed)
Addended by: Monte Fantasia on: 04/05/2017 11:51 AM   Modules accepted: Orders

## 2017-04-05 NOTE — Telephone Encounter (Signed)
[name], I would take the prednisone for a few weeks then slowly decrease in the following 2 more weeks: 10mg  a day (4 x 2.5mg  pills) 1 week: 7.5mg  daily (3 pills) 1 week: 5.0mg  daily (2 pills) Then stay on 2.5mg  daily   Also, will fax him a prescription for ativan he can take while on his flight. May cause sedation so don;t take it while driving. thanks

## 2017-04-05 NOTE — Telephone Encounter (Signed)
Talked to pt who reports feeling better after taking prednisone 10 mg daily for about 1 1/2 wks and would like refills. Pt also mentioned that he is travelling to [place] w/ plans to leave on 05/13/17. Reports that he has increased pain/pressure/vertigo when flying esp during take off/landing. Asks if there is something that Dr. Jaynee Eagles could prescribe for him to take for anxiety/symptoms.

## 2017-04-09 NOTE — Telephone Encounter (Signed)
Case [id number] approved coverage for prednisone 5 mg tab through 04/14/2020.

## 2017-04-09 NOTE — Telephone Encounter (Signed)
Lorazepam PA approved. Case [id number]; Coverage Start Date:03/10/2017; Coverage End Date:04/09/2018.

## 2017-04-09 NOTE — Addendum Note (Signed)
Addended by: Monte Fantasia on: 04/09/2017 08:15 AM   Modules accepted: Orders

## 2017-06-21 ENCOUNTER — Other Ambulatory Visit: Payer: Self-pay | Admitting: Neurology

## 2017-07-02 ENCOUNTER — Telehealth: Payer: Self-pay | Admitting: Neurology

## 2017-07-02 ENCOUNTER — Other Ambulatory Visit: Payer: Self-pay | Admitting: *Deleted

## 2017-07-02 MED ORDER — PREDNISONE 10 MG PO TABS: 10.0000 mg | ORAL_TABLET | Freq: Every day | ORAL | 1 refills | Status: DC

## 2017-07-02 MED ORDER — TRAMADOL HCL 50 MG PO TABS: 50.0000 mg | ORAL_TABLET | Freq: Four times a day (QID) | ORAL | 1 refills | Status: DC | PRN

## 2017-07-02 NOTE — Telephone Encounter (Signed)
Patient is returning your call.  

## 2017-07-02 NOTE — Telephone Encounter (Signed)
Patient called back. He states his headache is had and has been ongoing for a month. He reports pain/pressure on R side of his head/eye. He states he has been out of the country in [place] and is still experiencing pain upon return. He is still taking Prednisone 2.5 mg daily but is not taking any tramadol which has been prescribed as a prn medication. I told him I would speak with Dr. Jaynee Eagles and then call him back.

## 2017-07-02 NOTE — Telephone Encounter (Signed)
Called pt back again after speaking with Dr. Jaynee Eagles. I LVM to let him know that he can come in for an infusion or we can increase his Prednisone. I will inform him of this when he calls back.   If pt calls back I will discuss increasing his Prednisone to 20 mg PO daily until his next appt on October 23rd or he can have an infusion in the office per Dr. Jaynee Eagles.

## 2017-07-02 NOTE — Telephone Encounter (Signed)
Called pt back. Voicemail answered with no ring. LVM for pt to call back.

## 2017-07-02 NOTE — Addendum Note (Signed)
Addended by: Gildardo Griffes on: 07/02/2017 12:00 PM   Modules accepted: Orders

## 2017-07-02 NOTE — Addendum Note (Signed)
Addended by: Lester Orient A on: 07/02/2017 12:06 PM   Modules accepted: Orders

## 2017-07-02 NOTE — Telephone Encounter (Signed)
Pt called [facility] is causing him to have double vision, pressure with the rt eye and rt side of the head is very painful. He is still taking a low dose of prednisone. Please call asap

## 2017-07-02 NOTE — Telephone Encounter (Signed)
Patient called back. Of note he is denying any double vision. He refused the offer for the infusion but wants to take Prednisone 10 mg PO daily instead of 20 mg. He also asked for a refill of Tramadol due to the other being expired. Dr. Jaynee Eagles aware. Prescription for Prednisone 10 mg PO daily (30 tabs, 1 refill) sent to his pharmacy. Tramadol 50 mg PO every 6 hours prn printed for MD signature and will then fax to pharmacy.

## 2017-07-02 NOTE — Telephone Encounter (Signed)
LVM again for patient to call back. Also apologized for missing his call.

## 2017-07-02 NOTE — Telephone Encounter (Signed)
Tramadol RX faxed to [facility]. Received a receipt of confirmation.

## 2017-07-17 ENCOUNTER — Ambulatory Visit (INDEPENDENT_AMBULATORY_CARE_PROVIDER_SITE_OTHER): Payer: Medicare Other | Admitting: Neurology

## 2017-07-17 ENCOUNTER — Encounter: Payer: Self-pay | Admitting: Neurology

## 2017-07-17 VITALS — BP 124/74 | HR 75 | Wt 200.6 lb

## 2017-07-17 DIAGNOSIS — H499 Unspecified paralytic strabismus: Secondary | ICD-10-CM | POA: Diagnosis not present

## 2017-07-17 DIAGNOSIS — R519 Headache, unspecified: Secondary | ICD-10-CM

## 2017-07-17 DIAGNOSIS — Z79899 Other long term (current) drug therapy: Secondary | ICD-10-CM | POA: Diagnosis not present

## 2017-07-17 DIAGNOSIS — R51 Headache: Secondary | ICD-10-CM

## 2017-07-17 DIAGNOSIS — D496 Neoplasm of unspecified behavior of brain: Secondary | ICD-10-CM

## 2017-07-17 DIAGNOSIS — H532 Diplopia: Secondary | ICD-10-CM | POA: Diagnosis not present

## 2017-07-17 DIAGNOSIS — H4940 Progressive external ophthalmoplegia, unspecified eye: Secondary | ICD-10-CM

## 2017-07-17 DIAGNOSIS — H05111 Granuloma of right orbit: Secondary | ICD-10-CM

## 2017-07-17 NOTE — Progress Notes (Signed)
[facility]    Provider:  Dr Jaynee Eagles Referring Provider: Mayra Neer, MD Primary Care Physician:  Mayra Neer, MD  CC: [name]  Interval history; he was on 12m daily and symptoms returned, headache, right eye pain, diplopia. Likely again TLisette Abu Discussed long-term effects of steroids and changing to Rituximab.   Interval history 01/15/2017: He is taking 67every other day of prednisone. He had another episode of diplopia and he increased his prednisone to 67mor a week and it improved.He feels numbness on his right side of his face that is variable with tingling in his right ear. That started with the TolLisette Abue'll continue low-dose steroids 2.5mg67mery other day. 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 He declines any steroid. He was not taking his steroids consistently when he had the flare. Discussed trying 2.5mg 67mery other day and taking it consistently. when he had the flare. Discussed trying 2.5mg 48mry other day and taking it consistently.   Interval history 07/18/2016: [facility] as well as hematology oncology. There is no other cause found for his orbital inflammation, diagnosed with noninfectious orbital inflammation or [name] syndrome. Patient has been in multiple rounds of steroids. Suggest steroid sparing agents given the side effects of long-term steroid use. 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 Discussed steroid sparing agents. He declines at this time, would prefer to stay on low-dose steroids. Will try 2.5mg ev55m other day. Steroids can worsen glaucoma so need to follow up with ophthalmologist and let him know about the steroids and discuss wit him. Discussed side effects of long-term steroids.    [facility] 04[facility]2016:  BRAIN Calvarium/skull base: No focal marrow replacing lesion suggestive of neoplasm. Paranasal sinuses: Imaged portions clear. Brain: Previously seen ill-defined enhancing tissue within the bilateral cavernous sinuses has essentially resolved and is favored to represent response to therapy in this patient with a diagnosis of [name] syndrome. Mild asymmetric prominence of the right 3rd cranial nerve may reflect residual inflammation. Similar atrophy of the right optic nerve. No evidence of an acute abnormality. No significant white matter disease or acute ischemia. No mass effect, hemorrhage, or hydrocephalus. No abnormal enhancement to suggest neoplasm, abscess, or mass lesion. Grossly normal flow voids in the major intracranial arteries and dural venous sinuses. Additional comments: Multilevel spondylosis of the imaged cervical spine.  HPI: ElnathaRITHIK [id number].o.[email or web address] here as a referral from Dr. Shaw foBrigitte Pulsepeated episodes of painful ophthalmoplegia resolved with high dose steroids suspicious of Tolosa Hunt Syndrome.   Patient is a lovely 66 year53ld gentleman who is here for another episode of diplopia this time with right eye ptosis as well. 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 He was originally seen by me in April 2016 for diplopia and left-sided eye pain of unknown etiology. He was having pain on the left side of the head, pain on eye movement, headache, cramping to the middle of the head without light sensitivity, no sound sensitivity, no nausea, no vomiting He was evaluated inpatient and MRIs(MRI brain, MRI orbits, MRV) and labs(esr,crp) did not reveal etiology. 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 Further re-examination of MRI imaging of the orbits showed enhancing soft tissue mass at the left orbital apex, possibly suggesting tolosa hunt. The symptoms  resolved with a course of high-dose steroids.  Patient  returned in October 2016 with similar problems in the opposite eye, the right eye. He described pain in the back of the right eye, right temple cramping pain, double vision, side by side, with both eyes open, pain on eye movement. He had throbbing pain behind the right eye. 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 MRI of the orbits and brain now showed "asymmetry of the cavernous sinuses due to enhancing soft tissue surrounding the right internal carotid artery. 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 This has occurred since the MRI dated 12/29/2014. At that time, there appeared to be similar changes involving the left cavernous sinus." Patient's symptoms again resolved with high-dose steroids. 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 Onset 10 days ago and progressive with vision changes(diplopia).Patient cannot afford anymore imaging. He describes the same symptoms of headaches, pressure behind the eyes, pain on eye movement, blurry and double vision and new right ptosis. No predilection for time of day.   Interval history 03/14/2016: Patient has been on a tapering course of steroids since last being seen. Started at 67m and last dose was a week ago and now he is starting to have the headache again. Will restart at 275mand taper more slowly. Today the 3rd nerve palsy is reolved.   Imaging and Labwork:  MR Brain and Orbits 12/29/2014:Personal examination of MRI or the orbits may show enhancing soft tissue mass at the left orbital apex, possibly suggesting tolosa hunt. Formal report: 1. 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 2. No other focal lesion of the  orbits to explain diplopia. 3. Mild periventricular and subcortical T2 changes are slightly advanced for age. The finding is nonspecific but can be seen in the setting of chronic microvascular ischemia, a demyelinating process such as multiple sclerosis, vasculitis, complicated migraine headaches, or as the sequelae of a prior infectious or inflammatory process. 4. No focal pathology within the occipital cortex.  MRV 01/13/2015:Normal MRV head (without).  MRI of the brain and orbits 11/08/2015: 1. Asymmetry of the cavernous sinuses due to enhancing soft tissue surrounding the right internal carotid artery. 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 This has occurred since the MRI dated 12/29/2014. At that time, there appeared to be similar changes involving the left cavernous sinus. 2. A few scattered T2/FLAIR hyperintense foci consistent with mild chronic microvascular ischemic change. This appears to have progressed slightly when compared to the prior MRI.  Serum labs unremarkable: TSH, crp, sed rate, cbc, cmp, RF, ANA, RPR, CK, AchR antibodies, NMO, Lyme, pan-anca, IFE, paraneoplastic panel, B12, folate, HIV, Ace  CSF unremarkable: cytology,IGG index, VDRL, oligoclonal bands, ace, , glucose, cell count and diff, tuberculosis, fungus, gram stain, culture.  CSF mild increased protein 62. Review of Systems: Patient complains of symptoms per HPI as well as the following symptoms: no changes in bowel/bladder, headache. Pertinent negatives and positives per HPI. All others negative.   Social History   Social History  . Marital status: Married    Spouse name: N/A  . Number of children: 4  . Years of education: Bachelor's   Occupational History  . [employer]    Social History Main Topics  . Smoking status: Former Smoker    Years: 10.00    Types: Cigarettes    Quit date: 09/25/1976  . Smokeless tobacco: Never Used  .  Alcohol use 0.0 oz/week     Comment: Once per month  . Drug use: No  . Sexual activity: Not on file   Other Topics Concern  . Not on file   Social History Narrative   Lives at home with wife.   Right handed.   Caffeine use: 1 cup coffee per day.   Occasionally drinks soda and/or tea     Family History  Problem Relation Age of Onset  . Glaucoma Mother   . Pancreatic cancer Mother   . Glaucoma Father     Past Medical History:  Diagnosis Date  . Anxiety   . Arthritis   . Back pain    Fragments in vertebrae   . Elevated cholesterol   . Glaucoma    right worse than left  . High cholesterol   . Hypertension   . [name] syndrome     Past Surgical History:  Procedure Laterality Date  . KNEE ARTHROSCOPY Left 1984    Current Outpatient Prescriptions  Medication Sig Dispense Refill  . aspirin 81 MG tablet Take 81 mg by mouth daily.    Marland Kitchen atenolol (TENORMIN) 25 MG tablet Take 25 mg by mouth daily.    . benzonatate (TESSALON) 100 MG capsule Take 1 capsule (100 mg total) by mouth every 8 (eight) hours. 21 capsule 0  . co-enzyme Q-10 30 MG capsule Take 30 mg by mouth daily as needed (with joint pain).    . dorzolamide-timolol (COSOPT) 22.3-6.8 MG/ML ophthalmic solution Place 1 drop into both eyes 2 (two) times daily.    Marland Kitchen ezetimibe (ZETIA) 10 MG tablet Take 10 mg by mouth every evening.    Marland Kitchen HYDROcodone-acetaminophen (NORCO/VICODIN) 5-325 MG per tablet Take 1 tablet by mouth as needed.  0  . HYDROcodone-homatropine (HYCODAN) 5-1.5 MG/5ML syrup Take 5 mLs by mouth every 6 (six) hours as needed for cough. 120 mL 0  . ibuprofen (ADVIL,MOTRIN) 200 MG tablet Take 400 mg by mouth every 6 (six) hours as needed for headache.    Marland Kitchen LORazepam (ATIVAN) 0.5 MG tablet Take 1 tablet (0.5 mg total) by mouth every 8 (eight) hours as needed for anxiety. 30 tablet 0  . LUMIGAN 0.01 % SOLN Place 1 drop into both eyes daily. At bedtime    . Multiple Vitamin (MULTIVITAMIN WITH MINERALS) TABS  tablet Take 1 tablet by mouth daily.    . Omega-3 Fatty Acids (FISH OIL) 1200 MG CAPS Take 1 capsule by mouth daily.    . predniSONE (DELTASONE) 10 MG tablet Take 1 tablet (10 mg total) by mouth daily with breakfast. 30 tablet 1  . traMADol (ULTRAM) 50 MG tablet Take 1 tablet (50 mg total) by mouth every 6 (six) hours as needed. 60 tablet 1   No current facility-administered medications for this visit.     Allergies as of 07/17/2017 - Review Complete 07/17/2017  Allergen Reaction Noted  . Alphagan p [brimonidine tartrate] Other (See Comments) 12/29/2014  . Lipitor [atorvastatin] Other (See Comments) 01/04/2015    Vitals: BP 124/74   Pulse 75   Wt 200 lb 9.6 oz (91 kg)   BMI 27.98 kg/m  Last Weight:  Wt Readings from Last 1 Encounters:  07/17/17 200 lb 9.6 oz (91 kg)   Last Height:  Ht Readings from Last 1 Encounters:  01/15/17 _0  (1.803 m)    Neuro: Detailed Neurologic Exam  Speech:  Speech is normal; fluent and spontaneous with normal comprehension.  Cognition:  The patient is oriented to person, place, and time;   recent and remote memory intact;   language fluent;   normal attention, concentration,   fund of knowledge Cranial Nerves:  (Chronic right eye glaucoma) Right pupil unreactive and 2-51m, right optic nerve pallor and atrophy, right APD(all chronic due to Glaucoma). Left pupil pinpoint. 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 Decreased corneal reflex right eye. Visual fields are limited in the right eye due to severe chronic glaucoma. VF intact left eye. Trigeminal face sensation is intact and the muscles of mastication are normal. The face is otherwise symmetric. The palate elevates in the midline. Hearing intact. Voice is normal. Shoulder shrug is normal. The tongue has normal motion without fasciculations.   Coordination:  Normal finger to nose and heel to shin. Normal rapid alternating movements.   Gait:  Heel-toe and  tandem gait are normal.   Motor Observation:  No asymmetry, no atrophy, and no involuntary movements noted. Tone:  Normal muscle tone.   Posture:  Posture is normal. normal erect   Strength:  Strength is V/V in the upper and lower limbs.    Sensation: intact to LT    Assessment/Plan: ESAMUELE STOREYis a lovely 66y.o. male here as a referral from Dr. SBrigitte Pulsefor repeated episodes of painful ophthalmoplegia resolved with high dose steroids. 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 Episodes in the past resolved with steroids.Review of MRI or the orbits have shown enhancing soft tissue mass at the orbital apex unilateral to symptoms, suggesting tolosa hunt. Extensive workup, imaging, labs, csf as above.  [facility]. There is no other cause found for his orbital inflammation, diagnosed with noninfectious orbital inflammation or [name] syndrome.    -Patient improved on steroids but headache returned again. We'll continue 113mevery other day. 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   - will start initiation of rituximab which is the best evidence in resistant [name] syndrome, patient continues daily steroids with recurrence, also discussed risks of this medication especially the serious sequelae  - Patient initially agreed to Rituximab, then said he wants to think about it. Will reorder MRI brain and orbits.   [facility], [place] En[place]  Procedures  . MR BRAIN W WO CONTRAST  . MR ORBITS W WO CONTRAST  . HepB+HepC+HIV Panel  . Comprehensive metabolic panel  . CBC with  Differential/Platelets  . IgG, IgA, IgM    Cc: KiPenntreet [phone]. ShBrigitte PulseA total of 25 minutes was spent in with this patient. Over half this time was spent on counseling patient on the ToLobelvilleiagnosis and different therapeutic options available.    AnSarina IllMD  [facility] [address] [address]Phone [phone]

## 2017-07-17 NOTE — Patient Instructions (Signed)

## 2017-07-19 ENCOUNTER — Other Ambulatory Visit: Payer: Self-pay | Admitting: Neurology

## 2017-07-19 ENCOUNTER — Telehealth: Payer: Self-pay | Admitting: *Deleted

## 2017-07-19 DIAGNOSIS — B161 Acute hepatitis B with delta-agent without hepatic coma: Secondary | ICD-10-CM

## 2017-07-19 DIAGNOSIS — Z79899 Other long term (current) drug therapy: Secondary | ICD-10-CM

## 2017-07-19 DIAGNOSIS — B169 Acute hepatitis B without delta-agent and without hepatic coma: Secondary | ICD-10-CM

## 2017-07-19 LAB — HEPB+HEPC+HIV PANEL
HEP B C IGM: NEGATIVE
HEP B C TOTAL AB: POSITIVE — AB
HEP B E AB: POSITIVE — AB
HEP B S AG: NEGATIVE
HIV Screen 4th Generation wRfx: NONREACTIVE
Hep B E Ag: NEGATIVE
Hep B Surface Ab, Qual: REACTIVE

## 2017-07-19 LAB — COMPREHENSIVE METABOLIC PANEL
A/G RATIO: 1.4 (ref 1.2–2.2)
ALT: 19 IU/L (ref 0–44)
AST: 15 IU/L (ref 0–40)
Albumin: 4.1 g/dL (ref 3.6–4.8)
Alkaline Phosphatase: 53 IU/L (ref 39–117)
BUN / CREAT RATIO: 10 (ref 10–24)
BUN: 10 mg/dL (ref 8–27)
Bilirubin Total: 0.4 mg/dL (ref 0.0–1.2)
CALCIUM: 9.9 mg/dL (ref 8.6–10.2)
CHLORIDE: 100 mmol/L (ref 96–106)
CO2: 24 mmol/L (ref 20–29)
Creatinine, Ser: 1.02 mg/dL (ref 0.76–1.27)
GFR, EST AFRICAN AMERICAN: 88 mL/min/{1.73_m2} (ref >59)
GFR, EST NON AFRICAN AMERICAN: 76 mL/min/{1.73_m2} (ref >59)
Globulin, Total: 2.9 g/dL (ref 1.5–4.5)
Glucose: 138 mg/dL — ABNORMAL HIGH (ref 65–99)
Potassium: 4.6 mmol/L (ref 3.5–5.2)
Sodium: 140 mmol/L (ref 134–144)
Total Protein: 7 g/dL (ref 6.0–8.5)

## 2017-07-19 LAB — CBC WITH DIFFERENTIAL/PLATELET
BASOS ABS: 0 10*3/uL (ref 0.0–0.2)
Basos: 1 %
EOS (ABSOLUTE): 1 10*3/uL — ABNORMAL HIGH (ref 0.0–0.4)
Eos: 14 %
HEMOGLOBIN: 14.3 g/dL (ref 13.0–17.7)
Hematocrit: 44.3 % (ref 37.5–51.0)
IMMATURE GRANULOCYTES: 0 %
Immature Grans (Abs): 0 10*3/uL (ref 0.0–0.1)
Lymphocytes Absolute: 1.6 10*3/uL (ref 0.7–3.1)
Lymphs: 23 %
MCH: 31.2 pg (ref 26.6–33.0)
MCHC: 32.3 g/dL (ref 31.5–35.7)
MCV: 97 fL (ref 79–97)
MONOCYTES: 5 %
MONOS ABS: 0.3 10*3/uL (ref 0.1–0.9)
NEUTROS PCT: 57 %
Neutrophils Absolute: 3.9 10*3/uL (ref 1.4–7.0)
Platelets: 243 10*3/uL (ref 150–379)
RBC: 4.58 x10E6/uL (ref 4.14–5.80)
RDW: 13.7 % (ref 12.3–15.4)
WBC: 6.8 10*3/uL (ref 3.4–10.8)

## 2017-07-19 LAB — IGG, IGA, IGM
IGG (IMMUNOGLOBIN G), SERUM: 1294 mg/dL (ref 700–1600)
IgA/Immunoglobulin A, Serum: 288 mg/dL (ref 61–437)
IgM (Immunoglobulin M), Srm: 89 mg/dL (ref 20–172)

## 2017-07-19 NOTE — Telephone Encounter (Signed)
-----   Message from Melvenia Beam, MD sent at 07/19/2017  8:05 AM EDT ----- One of his markers for Hepatitis B is elevated which could mean acute new infection or old chronic infection. Not sure which one. Has he had Hepatitis B in the past? Should recheck labs in 2 weeks. thanks

## 2017-07-19 NOTE — Telephone Encounter (Signed)
Called patient, he verbalized understanding of the following from Dr. Jaynee Eagles: One of his markers for Hepatitis B is elevated which could mean acute new infection or old chronic infection. Not sure which one. Has he had Hepatitis B in the past? Should recheck labs in 2 weeks.  He denied history of Hepatitis B and was unsure if he had the vaccine in the past. He will see his PCP if he develops any symptoms. He asked what hepatitis B symptoms were and I reviewed the following with him straight from the [facility] website: Fever, Fatigue, Loss of appetite, Nausea Vomiting, Abdominal pain, Dark urine, Clay-colored bowel movements, Joint pain, Jaundice (yellow color in the skin or the eyes).   He will get follow up labs here in 2 weeks.

## 2017-08-02 ENCOUNTER — Ambulatory Visit
Admission: RE | Admit: 2017-08-02 | Discharge: 2017-08-02 | Disposition: A | Discharge status: Home / Self Care | Payer: BLUE CROSS/BLUE SHIELD | Source: Ambulatory Visit | Attending: Neurology | Admitting: Neurology

## 2017-08-02 ENCOUNTER — Telehealth: Payer: Self-pay | Admitting: Neurology

## 2017-08-02 DIAGNOSIS — H532 Diplopia: Secondary | ICD-10-CM

## 2017-08-02 DIAGNOSIS — Z79899 Other long term (current) drug therapy: Secondary | ICD-10-CM

## 2017-08-02 DIAGNOSIS — D496 Neoplasm of unspecified behavior of brain: Secondary | ICD-10-CM

## 2017-08-02 DIAGNOSIS — H499 Unspecified paralytic strabismus: Secondary | ICD-10-CM

## 2017-08-02 DIAGNOSIS — R51 Headache: Secondary | ICD-10-CM

## 2017-08-02 DIAGNOSIS — H4940 Progressive external ophthalmoplegia, unspecified eye: Secondary | ICD-10-CM

## 2017-08-02 DIAGNOSIS — H05111 Granuloma of right orbit: Secondary | ICD-10-CM

## 2017-08-02 DIAGNOSIS — R519 Headache, unspecified: Secondary | ICD-10-CM

## 2017-08-02 MED ORDER — GADOBENATE DIMEGLUMINE 529 MG/ML IV SOLN
20.0000 mL | Freq: Once | INTRAVENOUS | Status: AC | PRN
  Administered 2017-08-02: 20 mL via INTRAVENOUS

## 2017-08-02 NOTE — Telephone Encounter (Signed)
Pt: has called, he states [facility] told him to notify Dr Cathren Laine RN that he just had MRI and had difficulty with contrast. He is asking for a call back

## 2017-08-05 NOTE — Telephone Encounter (Signed)
MRI of the brain was much improved especially since last image. He still has a headache. Discussed starting a steroid sparing agent, will start the approval process.

## 2017-08-07 NOTE — Telephone Encounter (Signed)
Todd [name], would you ask Tallie to come for labs? Last month he has +hepatitis B I just wanted to repeat to make sure it wasn't in error, need to do this before any treatment is started  thanks

## 2017-08-07 NOTE — Telephone Encounter (Signed)
Patient called back, he is aware of orders for labs and will come later this week. I advised him of our office hours, M-Th 8:00-5:00 and F 8:00-12:00. He can come anytime during office hours to get them. He verbalized understanding.

## 2017-08-07 NOTE — Telephone Encounter (Signed)
Called patient on home & mobile and LVMs asking for call back.

## 2017-08-08 NOTE — Telephone Encounter (Signed)
Patient came into the office with documentation of Hepatitis B vaccination and previous REACTIVE result of Hepatitis B Ant in January 2018. He wants Dr. Jaynee Eagles to be aware of this before he gets the labs repeated. I told him that she was out of the office and we will get back to him as soon as possible next week. He verbalized understanding and appreciation. I made copies of the documents and placed in file for Dr. Cathren Laine review and will send her this message.

## 2017-10-13 ENCOUNTER — Other Ambulatory Visit: Payer: Self-pay | Admitting: Neurology

## 2018-01-16 ENCOUNTER — Other Ambulatory Visit: Payer: Self-pay | Admitting: Neurology

## 2018-01-17 NOTE — Telephone Encounter (Signed)
Spoke with the patient. He stated he is taking 1 Prednisone 10 mg tablet every day to every other day depending on how he is doing. He has 2 tablets remaining. Discussed that Dr. Jaynee Eagles is not in office at this time but RN will notify work-in MD regarding refill. Pt verbalized appreciation.

## 2018-04-06 IMAGING — MR MR HEAD WO/W CM
16 of 17 series · 38 of 48 positions shown · non-contrast
Comparison: none

[Series 6: T1 · sagittal · 4.0mm · 0.75mm/px · 1 of 28 slices shown (1 of 5)]
[im 1/28]
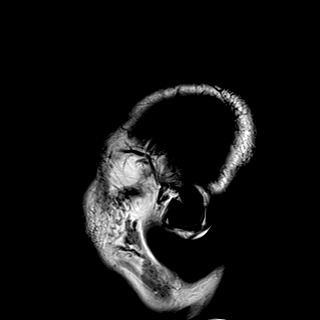

[Series 7: DWI · axial · 3.0mm · 1.44mm/px · z∈[-11,+136]mm · 4 of 84 slices shown (1 of 4)]
[im 1/84]
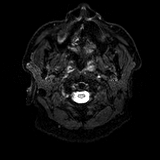
[im 28/84]
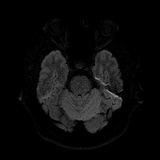
[im 56/84]
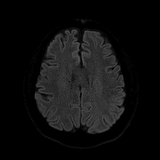
[im 84/84]
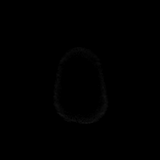

[Series 8: DWI · axial · 3.0mm · 1.44mm/px · z∈[-11,+136]mm · 2 of 41 slices shown (2 of 4)]
[im 1/41]
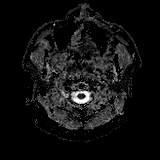
[im 41/41]
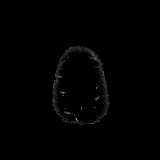

[Series 9: DWI · coronal · 5.0mm · 1.44mm/px · 3 of 64 slices shown (3 of 4)]
[im 1/64]
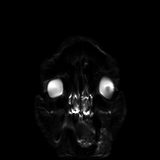
[im 32/64]
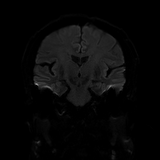
[im 64/64]
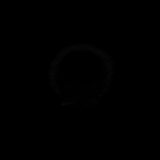

[Series 10: DWI · coronal · 5.0mm · 1.44mm/px · 2 of 32 slices shown (4 of 4)]
[im 1/32]
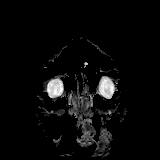
[im 32/32]
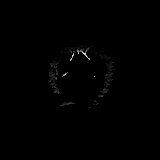

[Series 11: T2 · axial · 4.0mm · 0.36mm/px · 1 of 28 slices shown]
[im 1/28]
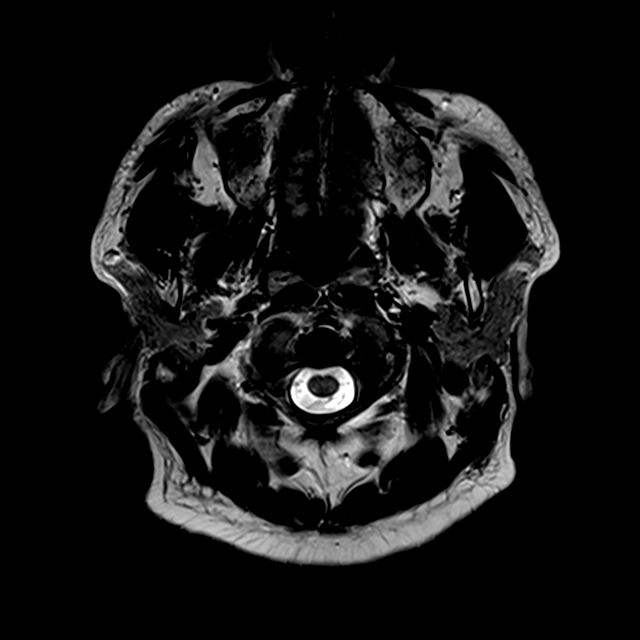

[Series 12: FLAIR · axial · 4.0mm · 0.72mm/px · 1 of 28 slices shown]
[im 1/28]
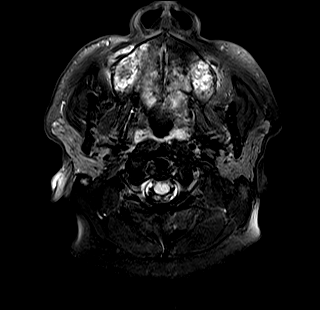

[Series 15: T1 · axial · 1.0mm · 0.90mm/px · z∈[-12,+130]mm · 8 of 144 slices shown (2 of 5)]
[im 1/144]
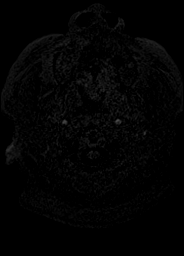
[im 21/144]
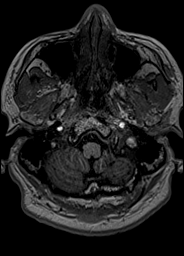
[im 41/144]
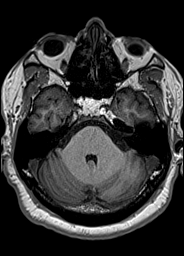
[im 62/144]
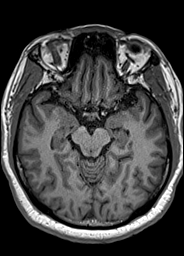
[im 82/144]
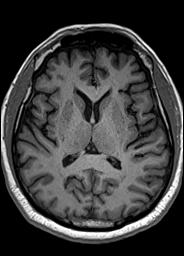
[im 103/144]
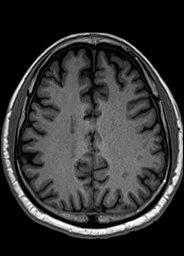
[im 123/144]
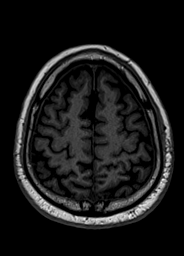
[im 144/144]
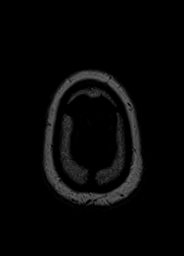

[Series 16: T2 fat-sat · axial · 2.5mm · 0.25mm/px · z∈[-4,+76]mm · 2 of 30 slices shown (1 of 2)]
[im 1/30]
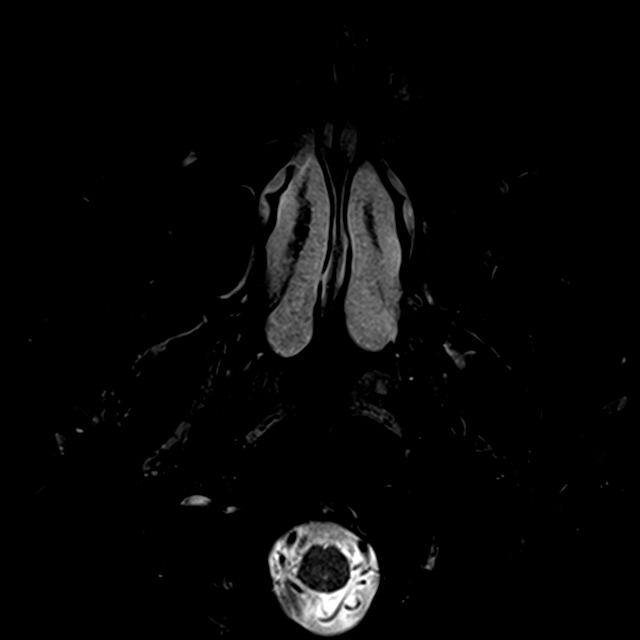
[im 30/30]
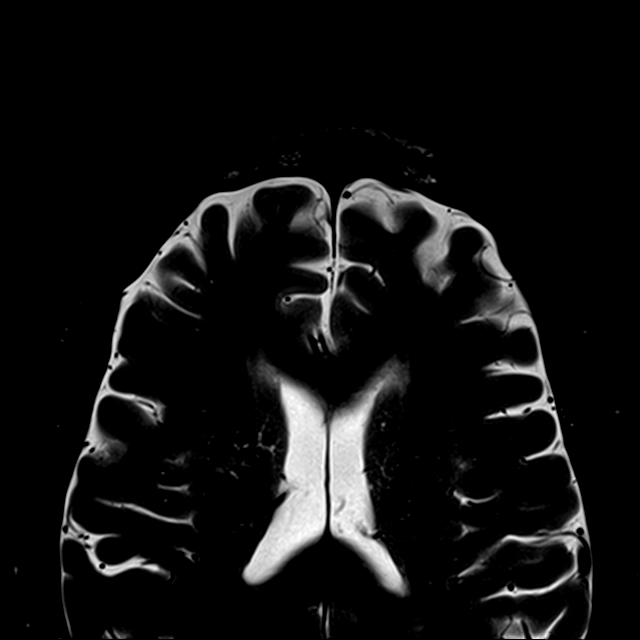

[Series 17: T2 fat-sat · coronal · 2.5mm · 0.25mm/px · 2 of 32 slices shown (2 of 2)]
[im 1/32]
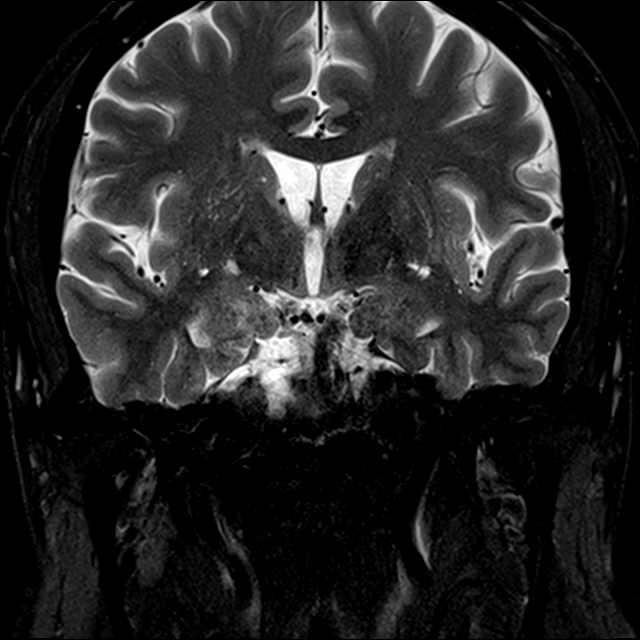
[im 32/32]
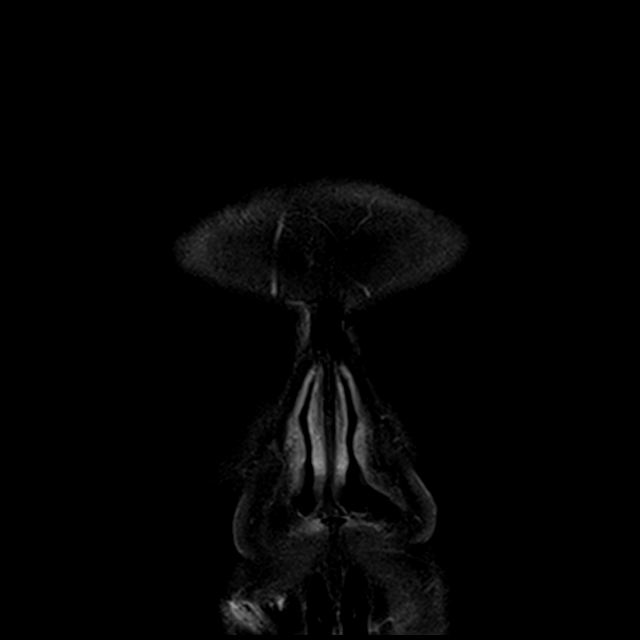

[Series 18: T1 · axial · 2.5mm · 0.25mm/px · 1 of 21 slices shown (3 of 5)]
[im 1/21]
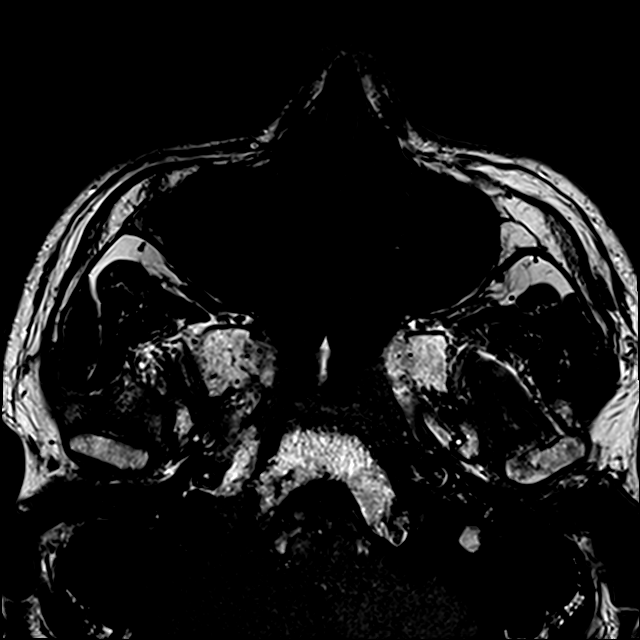

[Series 19: T1 · coronal · 2.5mm · 0.25mm/px · 2 of 32 slices shown (4 of 5)]
[im 1/32]
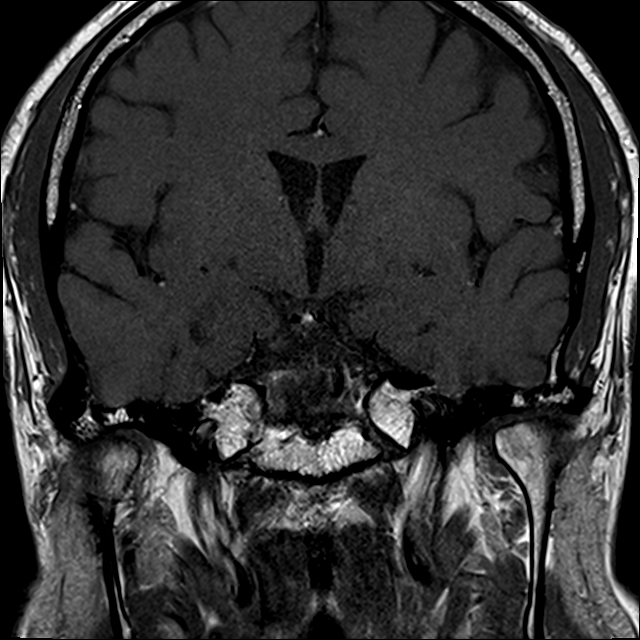
[im 32/32]
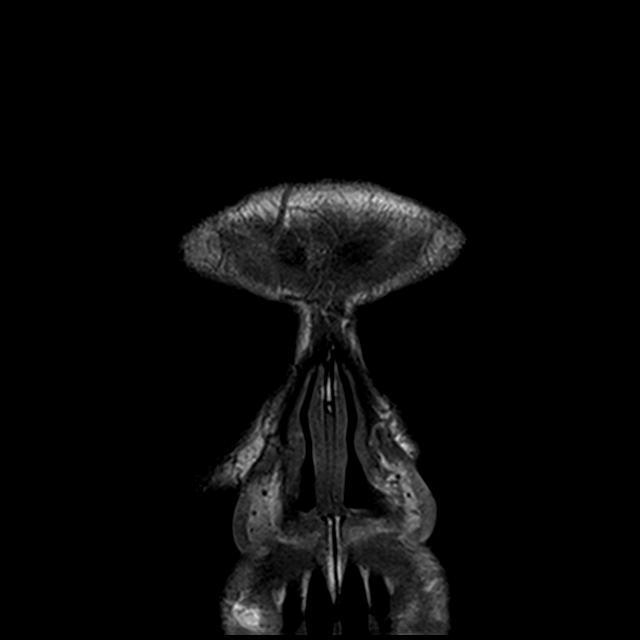

[Series 25: T2 post-contrast · coronal · 4.5mm · 0.36mm/px · 2 of 30 slices shown]
[im 1/30]
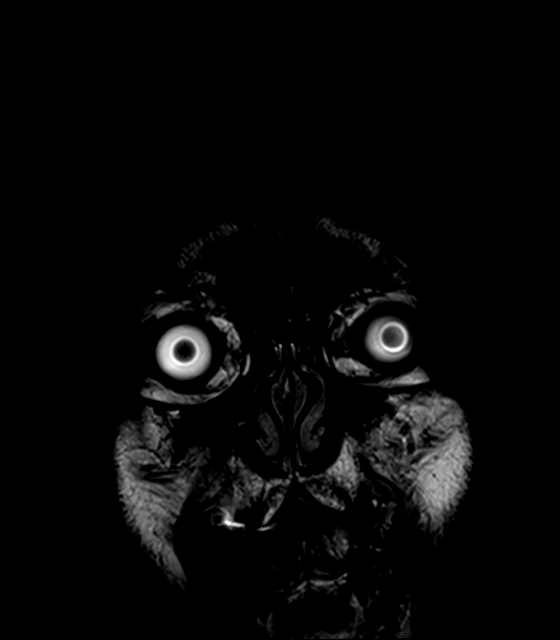
[im 30/30]
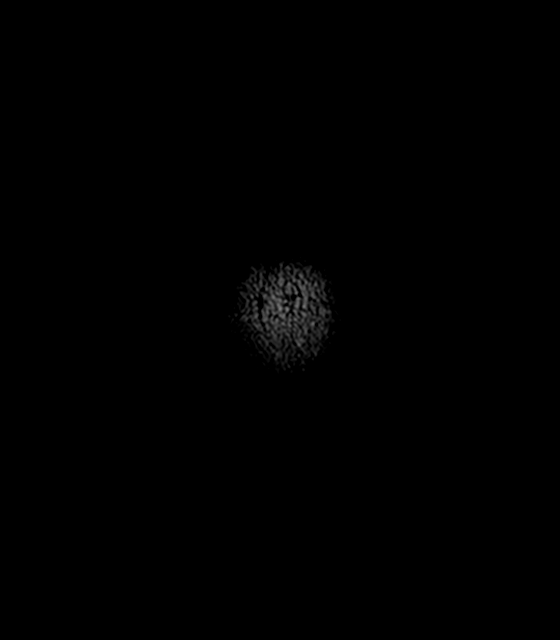

[Series 26: T1 post-contrast · coronal · 4.5mm · 0.90mm/px · 2 of 30 slices shown (1 of 2)]
[im 1/30]
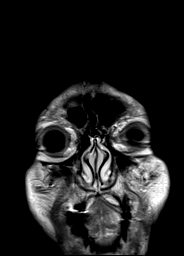
[im 30/30]
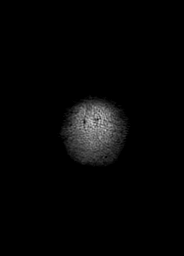

[Series 27: T1 post-contrast · coronal · 2.5mm · 0.50mm/px · 2 of 34 slices shown (2 of 2)]
[im 1/34]
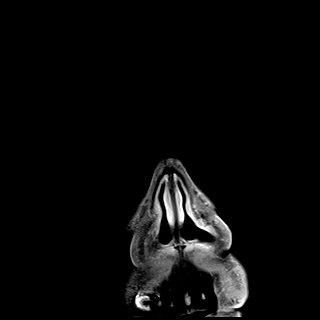
[im 34/34]
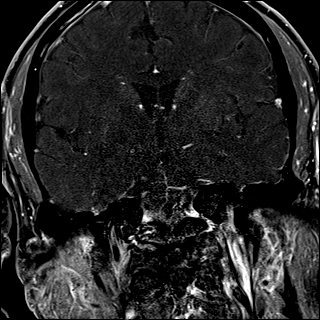

[Series 28: T1 · axial · 1.0mm · 0.90mm/px · z∈[-23,+16]mm · 3 of 144 slices shown (5 of 5)]
[im 1/144]
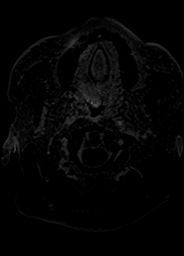
[im 21/144]
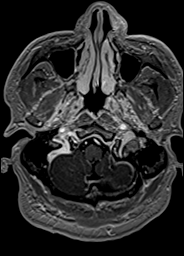
[im 41/144]
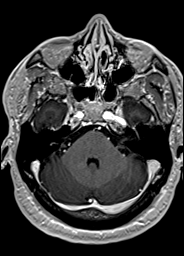

[38 of 48 positions shown; findings below may reference images not displayed]

Canned report from images found in remote index.

Refer to host system for actual result text.

## 2018-06-19 ENCOUNTER — Other Ambulatory Visit: Payer: Self-pay | Admitting: Family Medicine

## 2018-06-19 ENCOUNTER — Ambulatory Visit
Admission: RE | Admit: 2018-06-19 | Discharge: 2018-06-19 | Disposition: A | Discharge status: Home / Self Care | Payer: Medicare Other | Source: Ambulatory Visit | Attending: Family Medicine | Admitting: Family Medicine

## 2018-06-19 DIAGNOSIS — M7989 Other specified soft tissue disorders: Secondary | ICD-10-CM

## 2018-08-22 ENCOUNTER — Other Ambulatory Visit: Payer: Self-pay | Admitting: Neurology

## 2018-09-03 ENCOUNTER — Other Ambulatory Visit: Payer: Self-pay | Admitting: *Deleted

## 2018-09-03 ENCOUNTER — Telehealth: Payer: Self-pay | Admitting: Neurology

## 2018-09-03 MED ORDER — PREDNISONE 5 MG PO TABS: 5.0000 mg | ORAL_TABLET | Freq: Every day | ORAL | 0 refills | Status: DC | PRN

## 2018-09-03 NOTE — Telephone Encounter (Signed)
Per Dr. Jaynee Eagles, can see pt within the next couple months.  Can give refill of the prednisone 5 mg. RN called pt. He said his symptoms are stable. He hasn't needed the prednisone much. His eye is bothering him a little (pain) so he wants to go ahead and try the 5 mg tablets before it gets worse. RN offered appt with Dr. Jaynee Eagles for f/u. Scheduled pt for 10/15/17 @ 1:00 arrival 12:30-45. Pt verbalized appreciation.

## 2018-09-03 NOTE — Telephone Encounter (Signed)
He needs an appointment I haven;t seen him in over a year. Can we get him in soon?

## 2018-09-03 NOTE — Telephone Encounter (Signed)
Pt came in requesting a refill for Prednisone 5mg . Pt uses the [facility] on [address] number to reach the pt if any questions is [phone].

## 2018-10-15 ENCOUNTER — Ambulatory Visit: Payer: Medicare Other | Admitting: Neurology

## 2018-10-15 ENCOUNTER — Encounter: Payer: Self-pay | Admitting: Neurology

## 2018-10-15 VITALS — BP 127/75 | HR 78 | Ht 71.0 in | Wt 193.0 lb

## 2018-10-15 DIAGNOSIS — H499 Unspecified paralytic strabismus: Secondary | ICD-10-CM

## 2018-10-15 DIAGNOSIS — H4940 Progressive external ophthalmoplegia, unspecified eye: Secondary | ICD-10-CM

## 2018-10-15 NOTE — Patient Instructions (Signed)
Continue current Treatment

## 2018-10-15 NOTE — Progress Notes (Signed)
[facility]    Provider:  Dr Jaynee Eagles Referring Provider: Mayra Neer, MD Primary Care Physician:  Mayra Neer, MD  CC: Todd [name]  Interval history 10/15/2018: This is a lovely patient here for follow-up of [name] syndrome.  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  Today I reviewed literature with patient.  If left untreated symptoms may resolve spontaneously after about 8 weeks.  Steroids can help with the pain but unclear if it helps with the duration of ophthalmoplegia.  Little consideration has been given to alternative therapies usually because the rapid response of pain within 24 to 72 hours.  It is rare that individuals may need other immunosuppressive medications and because it is rare and not many patients need them, I have not been able to find any clinical trials for immunosuppressant therapy.  According to and NORD the [employer] of rare diseases, this is a rare disorder and usually this is treated with steroids. Discussed risks of long term steroids.   He has used them "every once in a while" he says the pain cam eon back in November/december. He can feel it coming again. So he will take 24m twice a week. He is following his glaucoma, is blood glucose, his BP as steroids can worsen all of this, recommended calcium and Vitamin D. Discussed taking the medication as little as possible and with severe flair and higher doses needed call the office.   Interval history; he was on 562mdaily and symptoms returned, headache, right eye pain, diplopia. Likely again ToLisette AbuDiscussed long-term effects of steroids and changing to Rituximab.   Interval history 01/15/2017: He is taking 36m536mvery other day of prednisone. He had another episode of diplopia and he increased his prednisone to 82m79mr a week and it improved.He feels numbness  on his right side of his face that is variable with tingling in his right ear. That started with the ToloLisette [name]'ll continue low-dose steroids 2.36mg 1mry other day. 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 He declines any steroid. He was not taking his steroids consistently when he had the flare. Discussed trying 2.36mg e63my other day and taking it consistently.   Interval history 07/18/2016: [facility] as well as hematology oncology. There is no other cause found for his orbital inflammation, diagnosed with noninfectious orbital inflammation or [name] syndrome. Patient has been in multiple rounds of steroids. Suggest steroid sparing agents given the side effects of long-term steroid use. Rituximab, cyclophosphamide, methotrexate and CellCept or some of the agents that have been used successfully in [name] syndrome.He is on prednisone 36mg ev70m other day. Discussed steroid sparing agents. He declines at this time, would prefer to stay on low-dose steroids. Will try 2.36mg eve236mother day. Steroids can worsen glaucoma so need to follow up with ophthalmologist and let him know about the steroids and discuss wit him. Discussed side effects of long-term steroids.    MRI [facility] 04/2016:  BRAIN Calvarium/skull base: No focal marrow replacing lesion suggestive of neoplasm. Paranasal sinuses: Imaged portions clear. Brain: Previously seen ill-defined enhancing tissue within the bilateral cavernous sinuses has essentially resolved and is favored to represent response to therapy in this patient with a diagnosis of [name] syndrome. Mild asymmetric prominence of the right 3rd cranial nerve may reflect residual  inflammation. Similar atrophy of the right optic nerve. No evidence of an acute abnormality. No significant white  matter disease or acute ischemia. No mass effect, hemorrhage, or hydrocephalus. No abnormal enhancement to suggest neoplasm, abscess, or mass lesion. Grossly normal flow voids in the major intracranial arteries and dural venous sinuses. Additional comments: Multilevel spondylosis of the imaged cervical spine.  HPI: Todd [name] is a 68 y.o. male here as a referral from Dr. Brigitte Pulse for repeated episodes of painful ophthalmoplegia resolved with high dose steroids suspicious of Tolosa Hunt Syndrome.   Patient is a lovely 68 year old gentleman who is here for another episode of diplopia this time with right eye ptosis as well. 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 He was originally seen by me in April 2016 for diplopia and left-sided eye pain of unknown etiology. He was having pain on the left side of the head, pain on eye movement, headache, cramping to the middle of the head without light sensitivity, no sound sensitivity, no nausea, no vomiting He was evaluated inpatient and MRIs(MRI brain, MRI orbits, MRV) and labs(esr,crp) did not reveal etiology. 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 Further re-examination of MRI imaging of the orbits showed enhancing soft tissue mass at the left orbital apex, possibly suggesting tolosa hunt. The symptoms resolved with a course of high-dose steroids.  Patient returned in October 2016 with similar problems in the opposite eye, the right eye. He described pain in the back of the right eye, right temple cramping pain, double vision, side by side, with both eyes open, pain on eye movement. He had throbbing pain behind the right eye. 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 MRI of the orbits and brain now showed "asymmetry of the cavernous sinuses due to enhancing soft tissue surrounding the right internal carotid artery. 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 This has  occurred since the MRI dated 12/29/2014. At that time, there appeared to be similar changes involving the left cavernous sinus." Patient's symptoms again resolved with high-dose steroids. 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 Onset 10 days ago and progressive with vision changes(diplopia).Patient cannot afford anymore imaging. He describes the same symptoms of headaches, pressure behind the eyes, pain on eye movement, blurry and double vision and new right ptosis. No predilection for time of day.   Interval history 03/14/2016: Patient has been on a tapering course of steroids since last being seen. Started at 40m and last dose was a week ago and now he is starting to have the headache again. Will restart at 220mand taper more slowly. Today the 3rd nerve palsy is reolved.   Imaging and Labwork:  MR Brain and Orbits 12/29/2014:Personal examination of MRI or the orbits may show enhancing soft tissue mass at the left orbital apex, possibly suggesting tolosa hunt. Formal report: 1. 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 2. No other focal lesion of the orbits to explain diplopia. 3. Mild periventricular and subcortical T2 changes are slightly advanced for age. The finding is nonspecific but can be seen in the setting of chronic microvascular ischemia, a demyelinating process such as multiple  sclerosis, vasculitis, complicated migraine headaches, or as the sequelae of a prior infectious or inflammatory process. 4. No focal pathology within the occipital cortex.  MRV 01/13/2015:Normal MRV head (without).  MRI of the brain and orbits 11/08/2015: 1. Asymmetry of the cavernous sinuses due to  enhancing soft tissue surrounding the right internal carotid artery. 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 This has occurred since the MRI dated 12/29/2014. At that time, there appeared to be similar changes involving the left cavernous sinus. 2. A few scattered T2/FLAIR hyperintense foci consistent with mild chronic microvascular ischemic change. This appears to have progressed slightly when compared to the prior MRI.  Serum labs unremarkable: TSH, crp, sed rate, cbc, cmp, RF, ANA, RPR, CK, AchR antibodies, NMO, Lyme, pan-anca, IFE, paraneoplastic panel, B12, folate, HIV, Ace  CSF unremarkable: cytology,IGG index, VDRL, oligoclonal bands, ace, , glucose, cell count and diff, tuberculosis, fungus, gram stain, culture.  CSF mild increased protein 62. Review of Systems: Patient complains of symptoms per HPI as well as the following symptoms: no changes in bowel/bladder, headache. Pertinent negatives and positives per HPI. All others negative.   Social History   Socioeconomic History  . Marital status: Married    Spouse name: Not on file  . Number of children: 4  . Years of education: Bachelor's  . Highest education level: Not on file  Occupational History  . Occupation: [employer]  Social Needs  . Financial resource strain: Not on file  . Food insecurity:    Worry: Not on file    Inability: Not on file  . Transportation needs:    Medical: Not on file    Non-medical: Not on file  Tobacco Use  . Smoking status: Former Smoker    Years: 10.00    Types: Cigarettes    Last attempt to quit: 09/25/1976    Years since quitting: 42.0  . Smokeless tobacco: Never Used  Substance and Sexual Activity  . Alcohol use: Yes    Comment: socially  . Drug use: No  . Sexual activity: Not on file  Lifestyle  . Physical activity:    Days per week: Not on file    Minutes per session: Not on file  . Stress: Not on file    Relationships  . Social connections:    Talks on phone: Not on file    Gets together: Not on file    Attends religious service: Not on file    Active member of club or organization: Not on file    Attends meetings of clubs or organizations: Not on file    Relationship status: Not on file  . Intimate partner violence:    Fear of current or ex partner: Not on file    Emotionally abused: Not on file    Physically abused: Not on file    Forced sexual activity: Not on file  Other Topics Concern  . Not on file  Social History Narrative   Lives at home with wife.   Right handed.   Caffeine use: 1 cup coffee per day.   Occasionally drinks soda and/or tea     Family History  Problem Relation Age of Onset  . Glaucoma Mother   . Pancreatic cancer Mother   . Glaucoma Father     Past Medical History:  Diagnosis Date  . Anxiety   . Arthritis   . Back pain    Fragments in vertebrae   . Elevated cholesterol   .  Glaucoma    right worse than left  . High cholesterol   . Hypertension   . [name] syndrome     Past Surgical History:  Procedure Laterality Date  . KNEE ARTHROSCOPY Left 1984    Current Outpatient Medications  Medication Sig Dispense Refill  . aspirin 81 MG tablet Take 81 mg by mouth daily.    Marland Kitchen atenolol (TENORMIN) 25 MG tablet Take 25 mg by mouth daily.    Marland Kitchen co-enzyme Q-10 30 MG capsule Take 30 mg by mouth daily as needed (with joint pain).    . dorzolamide-timolol (COSOPT) 22.3-6.8 MG/ML ophthalmic solution Place 1 drop into both eyes 2 (two) times daily.    Marland Kitchen ezetimibe (ZETIA) 10 MG tablet Take 10 mg by mouth every evening.    Marland Kitchen HYDROcodone-acetaminophen (NORCO/VICODIN) 5-325 MG per tablet Take 1 tablet by mouth as needed.  0  . ibuprofen (ADVIL,MOTRIN) 200 MG tablet Take 400 mg by mouth every 6 (six) hours as needed for headache.    . LUMIGAN 0.01 % SOLN Place 1 drop into both eyes daily. At bedtime    . Multiple Vitamin (MULTIVITAMIN WITH MINERALS) TABS  tablet Take 1 tablet by mouth daily.    . Omega-3 Fatty Acids (FISH OIL) 1200 MG CAPS Take 1 capsule by mouth daily.    . predniSONE (DELTASONE) 5 MG tablet Take 1 tablet (5 mg total) by mouth daily as needed. 30 tablet 0  . benzonatate (TESSALON) 100 MG capsule Take 1 capsule (100 mg total) by mouth every 8 (eight) hours. (Patient not taking: Reported on 10/15/2018) 21 capsule 0  . HYDROcodone-homatropine (HYCODAN) 5-1.5 MG/5ML syrup Take 5 mLs by mouth every 6 (six) hours as needed for cough. (Patient not taking: Reported on 10/15/2018) 120 mL 0  . LORazepam (ATIVAN) 0.5 MG tablet Take 1 tablet (0.5 mg total) by mouth every 8 (eight) hours as needed for anxiety. (Patient not taking: Reported on 10/15/2018) 30 tablet 0   No current facility-administered medications for this visit.     Allergies as of 10/15/2018 - Review Complete 10/15/2018  Allergen Reaction Noted  . Multihance [gadobenate] Nausea And Vomiting 08/02/2017  . Alphagan p [brimonidine tartrate] Other (See Comments) 12/29/2014  . Lipitor [atorvastatin] Other (See Comments) 01/04/2015    Vitals: BP 127/75 (BP Location: Right Arm, Patient Position: Sitting)   Pulse 78   Ht 5' 11"  (1.803 m)   Wt 193 lb (87.5 kg)   BMI 26.92 kg/m  Last Weight:  Wt Readings from Last 1 Encounters:  10/15/18 193 lb (87.5 kg)   Last Height:   Ht Readings from Last 1 Encounters:  10/15/18 5' 11"  (1.803 m)    Neuro: Detailed Neurologic Exam  Speech:  Speech is normal; fluent and spontaneous with normal comprehension.  Cognition:  The patient is oriented to person, place, and time;   recent and remote memory intact;   language fluent;   normal attention, concentration,   fund of knowledge Cranial Nerves:  (Chronic right eye glaucoma) Right pupil unreactive and 2-81m, right optic nerve pallor and atrophy, right APD(all chronic due to Glaucoma). Left pupil pinpoint. 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 Decreased  corneal reflex right eye. Visual fields are limited in the right eye due to severe chronic glaucoma. VF intact left eye. Trigeminal face sensation is intact and the muscles of mastication are normal. The face is otherwise symmetric. The palate elevates in the midline. Hearing intact. Voice is normal. Shoulder shrug is normal. The tongue has  normal motion without fasciculations.   Coordination:  Normal finger to nose and heel to shin. Normal rapid alternating movements.   Gait:  Heel-toe and tandem gait are normal.   Motor Observation:  No asymmetry, no atrophy, and no involuntary movements noted. Tone:  Normal muscle tone.   Posture:  Posture is normal. normal erect   Strength:  Strength is V/V in the upper and lower limbs.    Sensation: intact to LT    Assessment/Plan: Todd [name] is a lovely 68 y.o. male here as a referral from Dr. Brigitte Pulse for repeated episodes of painful ophthalmoplegia resolved with high dose steroids. 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 Episodes in the past resolved with steroids.Review of MRI or the orbits have shown enhancing soft tissue mass at the orbital apex unilateral to symptoms, suggesting tolosa hunt. Extensive workup, imaging, labs, csf as above.  Patient was evaluated at [facility] by neuro-ophthalmology as well as hematology oncology. There is no other cause found for his orbital inflammation, diagnosed with noninfectious orbital inflammation or [name] syndrome.   - This is a lovely patient here for follow-up of [name] syndrome.  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  Today I reviewed literature with patient.  If left untreated symptoms may resolve spontaneously after about 8 weeks.  Steroids can help with the pain but  unclear if it helps with the duration of ophthalmoplegia.  Little consideration has been given to alternative therapies usually because the rapid response of pain within 24 to 72 hours.   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   - Evaluated at [facility] neuro-ophthalmology and oncology/hematology, concurs with [place] risks of long-term steroids  Cc: [address] [phone] fax [phone], Dr. Brigitte Pulse  A total of 25 minutes was spent in with this patient. Over half this time was spent on counseling patient on the [place] diagnosis and different therapeutic options available.    Sarina Ill, MD  [facility] [address] [place], [place] [address]  Phone [phone] Fax [phone]

## 2018-11-06 ENCOUNTER — Other Ambulatory Visit: Payer: Self-pay | Admitting: Neurology

## 2019-10-21 ENCOUNTER — Ambulatory Visit: Payer: Medicare Other | Admitting: Neurology

## 2020-05-28 ENCOUNTER — Other Ambulatory Visit: Payer: Self-pay | Admitting: Neurology

## 2021-04-30 ENCOUNTER — Encounter (HOSPITAL_COMMUNITY): Payer: Self-pay | Admitting: Emergency Medicine

## 2021-04-30 ENCOUNTER — Emergency Department (HOSPITAL_COMMUNITY)
Admission: EM | Admit: 2021-04-30 | Discharge: 2021-04-30 | Disposition: A | Discharge status: Home / Self Care | Payer: Medicare Other | Attending: Student | Admitting: Student

## 2021-04-30 ENCOUNTER — Other Ambulatory Visit: Payer: Self-pay

## 2021-04-30 DIAGNOSIS — S0592XA Unspecified injury of left eye and orbit, initial encounter: Secondary | ICD-10-CM | POA: Diagnosis present

## 2021-04-30 DIAGNOSIS — Y9222 Religious institution as the place of occurrence of the external cause: Secondary | ICD-10-CM | POA: Diagnosis not present

## 2021-04-30 DIAGNOSIS — S0502XA Injury of conjunctiva and corneal abrasion without foreign body, left eye, initial encounter: Secondary | ICD-10-CM | POA: Diagnosis not present

## 2021-04-30 DIAGNOSIS — I10 Essential (primary) hypertension: Secondary | ICD-10-CM | POA: Diagnosis not present

## 2021-04-30 DIAGNOSIS — Z79899 Other long term (current) drug therapy: Secondary | ICD-10-CM | POA: Insufficient documentation

## 2021-04-30 DIAGNOSIS — Z87891 Personal history of nicotine dependence: Secondary | ICD-10-CM | POA: Diagnosis not present

## 2021-04-30 DIAGNOSIS — X58XXXA Exposure to other specified factors, initial encounter: Secondary | ICD-10-CM | POA: Diagnosis not present

## 2021-04-30 DIAGNOSIS — H1132 Conjunctival hemorrhage, left eye: Secondary | ICD-10-CM | POA: Insufficient documentation

## 2021-04-30 DIAGNOSIS — Z7982 Long term (current) use of aspirin: Secondary | ICD-10-CM | POA: Diagnosis not present

## 2021-04-30 MED ORDER — FLUORESCEIN SODIUM 1 MG OP STRP
1.0000 | ORAL_STRIP | Freq: Once | OPHTHALMIC | Status: AC
  Administered 2021-04-30: 1 via OPHTHALMIC
  Filled 2021-04-30: qty 1

## 2021-04-30 MED ORDER — TETRACAINE HCL 0.5 % OP SOLN
1.0000 [drp] | Freq: Once | OPHTHALMIC | Status: AC
  Administered 2021-04-30: 1 [drp] via OPHTHALMIC
  Filled 2021-04-30: qty 4

## 2021-04-30 MED ORDER — ERYTHROMYCIN 5 MG/GM OP OINT
TOPICAL_OINTMENT | Freq: Once | OPHTHALMIC | Status: AC
  Filled 2021-04-30: qty 3.5

## 2021-04-30 NOTE — ED Provider Notes (Signed)
Emergency Medicine Provider Triage Evaluation Note  Todd [name] , a 70 y.o. male  was evaluated in triage.  Pt complains of left eye pain and redness.  Patient states that earlier today he had a feeling something was in his eye.  Patient reports going to the mirror noticing that he had erythema to left eye.  Patient denies any vision loss, visual disturbance, pain with extraocular movement.    Patient reports history of glaucoma bilaterally.  Review of Systems  Positive: Foreign body sensation, eye redness Negative: Visual disturbance, loss of vision, eye discharge  Physical Exam  BP (!) 166/90   Pulse 80   Temp 98.2 F (36.8 C) (Oral)   Resp 16   SpO2 99%  Gen:   Awake, no distress   Resp:  Normal effort  MSK:   Moves extremities without difficulty  Other:  EOM intact bilaterally.  No pain with EOM.  Pupils PERRL.  Medical Decision Making  Medically screening exam initiated at 7:01 PM.  Appropriate orders placed.  Jen Mow was informed that the remainder of the evaluation will be completed by another provider, this initial triage assessment does not replace that evaluation, and the importance of remaining in the ED until their evaluation is complete.  The patient appears stable so that the remainder of the work up may be completed by another provider.      Dyann Ruddle 04/30/21 1912    Teressa Lower, MD 04/30/21 [phone]

## 2021-04-30 NOTE — ED Provider Notes (Signed)
[facility] DEPT Provider Note   CSN: [id number] Arrival date & time: 04/30/21  1849     History Chief Complaint  Patient presents with   Eye Problem    Todd [name] is a 70 y.o. male with PMH right-sided glaucoma, HLD, HTN who presents to the emergency department for evaluation of left eye pain.  The patient states that he was in church earlier today at a wedding ceremony when he felt a foreign body sensation in his eye.  He states that he went home and noticed a significant increase in the redness of the eye.  He put Visine drops in the eye but continued to have foreign body sensation and came to the ER for evaluation.  He denies vision loss, nausea, vomiting, chest pain, shortness of breath, fever or any other systemic symptoms.  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 Authorizing Provider  aspirin 81 MG tablet Take 81 mg by mouth daily.    [provider]  atenolol (TENORMIN) 25 MG tablet Take 25 mg by mouth daily.    [provider]  benzonatate (TESSALON) 100 MG capsule Take 1 capsule (100 mg total) by mouth every 8 (eight) hours. Patient not taking: Reported on 10/15/2018 12/29/14   Charlesetta Shanks, MD  co-enzyme Q-10 30 MG capsule Take 30 mg by mouth daily as needed (with joint pain).    [provider]  dorzolamide-timolol (COSOPT) 22.3-6.8 MG/ML ophthalmic solution Place 1 drop into both eyes 2 (two) times daily.    [provider]  ezetimibe (ZETIA) 10 MG tablet Take 10 mg by mouth every evening.    [provider]  HYDROcodone-acetaminophen (NORCO/VICODIN) 5-325 MG per tablet Take 1 tablet by mouth as needed. 10/12/14   [provider]  HYDROcodone-homatropine (HYCODAN) 5-1.5 MG/5ML syrup Take 5 mLs by mouth every 6 (six) hours as needed for cough. Patient not taking: Reported on 10/15/2018 12/29/14   Charlesetta Shanks, MD  ibuprofen (ADVIL,MOTRIN) 200 MG  tablet Take 400 mg by mouth every 6 (six) hours as needed for headache.    [provider]  LORazepam (ATIVAN) 0.5 MG tablet Take 1 tablet (0.5 mg total) by mouth every 8 (eight) hours as needed for anxiety. Patient not taking: Reported on 10/15/2018 04/05/17   Melvenia Beam, MD  LUMIGAN 0.01 % SOLN Place 1 drop into both eyes daily. At bedtime 11/01/15   [provider]  Multiple Vitamin (MULTIVITAMIN WITH MINERALS) TABS tablet Take 1 tablet by mouth daily.    [provider]  Omega-3 Fatty Acids (FISH OIL) 1200 MG CAPS Take 1 capsule by mouth daily.    [provider]  predniSONE (DELTASONE) 5 MG tablet TAKE 1 TABLET BY MOUTH DAILY AS NEEDED 05/31/20   Melvenia Beam, MD    Allergies     Multihance [gadobenate], Alphagan p [brimonidine tartrate], and Lipitor [atorvastatin]  Review of Systems   Review of Systems  Constitutional:  Negative for chills and fever.  HENT:  Negative for ear pain and sore throat.   Eyes:  Positive for pain and redness. Negative for photophobia and visual disturbance.  Respiratory:  Negative for cough and shortness of breath.   Cardiovascular:  Negative for chest pain and palpitations.  Gastrointestinal:  Negative for abdominal pain and vomiting.  Genitourinary:  Negative for dysuria and hematuria.  Musculoskeletal:  Negative for arthralgias and back pain.  Skin:  Negative for color change and rash.  Neurological:  Negative for seizures and syncope.  All other systems reviewed and are negative.  Physical Exam Updated Vital Signs BP (!) 166/90   Pulse 80   Temp 98.2 F (36.8 C) (Oral)   Resp 16   SpO2 99%   Physical Exam Vitals and nursing note reviewed.  Constitutional:      Appearance: He is well-developed.  HENT:     Head: Normocephalic and atraumatic.  Eyes:     Comments: Significant left-sided conjunctival injection and subconjunctival hemorrhage, corneal abrasion noted to the 11 o'clock position of the iris  Cardiovascular:     Rate and Rhythm: Normal rate and regular rhythm.     Heart sounds: No murmur heard. Pulmonary:     Effort: Pulmonary effort is normal. No respiratory distress.     Breath sounds: Normal breath sounds.  Abdominal:     Palpations: Abdomen is soft.     Tenderness: There is no abdominal tenderness.  Musculoskeletal:     Cervical back: Neck supple.  Skin:    General: Skin is warm and dry.  Neurological:     Mental Status: He is alert.    ED Results / Procedures / Treatments   Labs (all labs ordered are listed, but only abnormal results are displayed) Labs Reviewed - No data to display  EKG None  Radiology No results found.  Procedures Procedures   Medications Ordered in ED Medications  - No data to display  ED Course  I have reviewed the triage vital signs and the nursing notes.  Pertinent labs & imaging results that were available during my care of the patient were reviewed by me and considered in my medical decision making (see chart for details).    MDM Rules/Calculators/A&P                           Patient seen the emergency department for evaluation of conjunctival injection and left-sided eye pain and foreign body sensation.  Physical exam reveals  significant left-sided subconjunctival hemorrhage and fluorescein exam reveals a corneal abrasion at the 11 o'clock position over the iris.  Tono-Pen reveals a high pressure of 16 in the affected eye.  Pain and foreign body sensation relieved with tetracaine application and erythromycin ointment was placed in the patient's eye for suspected corneal abrasion.  He states he will call his ophthalmologist on Monday to evaluate for follow-up.  He was given the container of erythromycin ointment and discharged with instructions to apply this 3 times daily.  Final Clinical Impression(s) / ED Diagnoses Final diagnoses:  None    Rx / DC Orders ED Discharge Orders     None        Brittain Smithey, Debe Coder, MD 04/30/21 2332

## 2021-04-30 NOTE — ED Triage Notes (Signed)
Pt reports sudden onset of redness to left eye after "feeling like something was in it."  Denies vision loss.   Hx of BL glaucoma.

## 2021-04-30 NOTE — Discharge Instructions (Addendum)
You were seen in the emergency department for evaluation of pain.  Your examination today is consistent with a corneal abrasion and a antibiotic ointment will be provided prior to discharge today.  Please call your eye doctor on Monday for an urgent appointment to have your eye evaluated.  At this time you are safe for discharge.  Please continue to apply the antibiotic ointment until your follow-up with your ophthalmology doctor.

## 2021-05-03 ENCOUNTER — Telehealth: Payer: Self-pay | Admitting: Neurology

## 2021-05-03 NOTE — Telephone Encounter (Signed)
Pt request refill predniSONE (DELTASONE) 5 MG tablet at [facility]

## 2021-05-03 NOTE — Telephone Encounter (Signed)
I called pt and could not LVM )VM full.  Have not seen pt since 09/2018. Has appt 08/16/21 with Dr. Jaynee Eagles for dizziness/ pressure around both eyes.  He may need to see pcp/urgent care for evaluation.

## 2021-05-04 NOTE — Telephone Encounter (Signed)
I called pt and could not LM as mailbox full.  (2nd call).

## 2021-05-04 NOTE — Telephone Encounter (Signed)
I called pt back.  Patient has been having stabbing pains in the back of head and neck some heaviness in his eyes some lightheadedness since April 2022, for 2 to 3 months ago.  It is intermittent.  Saw his primary care last Monday Dr. Arbutus Ped covering for Dr. Brigitte Pulse.  Placed on prednisone 5 mg daily as needed.  Appointment scheduled in November with Dr. Jaynee Eagles.  He is on the wait list.  He may call to check to see if any cancellations I did place him on my wait list.  I will asked Dr. Cathren Laine is able to see a nurse practitioner.  Last time we saw him was January 2020.

## 2021-05-04 NOTE — Telephone Encounter (Signed)
Pt has called Lovey Newcomer, RN back.  Please call

## 2021-05-05 NOTE — Telephone Encounter (Signed)
I did not see anything soon as of this time.   Will look for cancellations on NP schedule.

## 2021-05-10 ENCOUNTER — Ambulatory Visit: Payer: Medicare Other | Admitting: Adult Health

## 2021-05-26 ENCOUNTER — Telehealth: Payer: Self-pay | Admitting: Neurology

## 2021-05-26 ENCOUNTER — Ambulatory Visit: Payer: Medicare Other | Admitting: Family Medicine

## 2021-05-26 ENCOUNTER — Encounter: Payer: Self-pay | Admitting: Family Medicine

## 2021-05-26 VITALS — BP 152/92 | HR 62 | Ht 71.0 in | Wt 189.0 lb

## 2021-05-26 DIAGNOSIS — H5713 Ocular pain, bilateral: Secondary | ICD-10-CM

## 2021-05-26 DIAGNOSIS — R519 Headache, unspecified: Secondary | ICD-10-CM

## 2021-05-26 DIAGNOSIS — H4940 Progressive external ophthalmoplegia, unspecified eye: Secondary | ICD-10-CM | POA: Diagnosis not present

## 2021-05-26 MED ORDER — PREDNISONE 5 MG PO TABS: 5.0000 mg | ORAL_TABLET | Freq: Every day | ORAL | 1 refills | Status: DC | PRN

## 2021-05-26 NOTE — Telephone Encounter (Signed)
Pt has appt 08-16-21, cancelled appt with MM/NP 04/2021 due to having covid.  I called and relayed did not have any earlier availability with Dr. Jaynee Eagles.  Rescheduled with AL/NP for 1130 today. Relayed to pt if this was an issue with appt then would call back.  Arrive 1100 for check in.  He verbalized understanding. Discussed with AL/NP ok to see pt.  I called pt to confirm time.  No new sx, he felt was the tolosa hunt syndrome, similar sx.

## 2021-05-26 NOTE — Telephone Encounter (Signed)
Pt request refill predniSONE (DELTASONE) 5 MG tablet at [facility]

## 2021-05-26 NOTE — Progress Notes (Signed)
Chief Complaint  Patient presents with   Follow-up    RM 15, alone. Last seen 10/15/18. Has pressure on back of right eye and now feels it is developing behind left eye. This started in the last month. Eyes feels dry/heavy.      HISTORY OF PRESENT ILLNESS: 05/26/21 ALL:  Todd [name] is a 70 y.o. male here today for follow up for . 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 He has been doing fairly well until about a month ago. He started developing right eye pressure around that time. He was diagnosed with Covid 8/14. Now he feels that he has pressure behind the left eye. He feels like he had an episode in May as well. He has not had double vision with current episode but did in May. He usually takes prednisone '5mg'$  daily for a few days (5-10 days) then is able to stop. No other stroke like symptoms.    HISTORY (copied from Dr Cathren Laine previous note)  Interval history 10/15/2018: This is a lovely patient here for follow-up of [name] syndrome.  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  Today I reviewed literature with patient.  If left untreated symptoms may resolve spontaneously after about 8 weeks.  Steroids can help with the pain but unclear if it helps with the duration of ophthalmoplegia.  Little consideration has been given to alternative therapies usually because the rapid response of pain within 24 to 72 hours.  It is rare that individuals may need other immunosuppressive medications and because it is rare and not many patients need them, I have not been able to find any clinical trials for immunosuppressant therapy.  According to and NORD the [employer] of rare diseases, this is a rare disorder and usually this is treated with steroids. Discussed risks of long term steroids.    He  has used them "every once in a while" he says the pain cam eon back in November/december. He can feel it coming again. So he will take '5mg'$  twice a week. He is following his glaucoma, is blood glucose, his BP as steroids can worsen all of this, recommended calcium and Vitamin D. Discussed taking the medication as little as possible and with severe flair and higher doses needed call the office.    Interval history; he was on '5mg'$  daily and symptoms returned, headache, right eye pain, diplopia. Likely again [name]. Discussed long-term effects of steroids and changing to Rituximab.    Interval history 01/15/2017: He is taking '5mg'$  every other day of prednisone. He had another episode of diplopia and he increased his prednisone to '20mg'$  for a week and it improved.He feels numbness on his right side of his face that is variable with tingling in his right ear. That started with the [name]. We'll continue low-dose steroids 2.'5mg'$  every other day.  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 He declines any steroid. He was not taking his steroids consistently when he had the flare. Discussed trying 2.'5mg'$  every other day and taking it consistently.    Interval history 07/18/2016: Patient was evaluated at [facility] by neuro-ophthalmology as well as hematology oncology. There is no other cause found for his orbital inflammation, diagnosed with noninfectious orbital inflammation or [name] syndrome. Patient has been in  multiple rounds of steroids. Suggest steroid sparing agents given the side effects of long-term steroid use. Rituximab, cyclophosphamide, methotrexate and CellCept or some of the agents that have been used successfully in [name] syndrome.He is on prednisone '5mg'$  every other day. Discussed steroid sparing agents. He declines at this time, would prefer to stay on low-dose  steroids. Will try 2.'5mg'$  every other day. Steroids can worsen glaucoma so need to follow up with ophthalmologist and let him know about the steroids and discuss wit him. Discussed side effects of long-term steroids.      MRI [facility] 04/2016:  BRAIN Calvarium/skull base: No focal marrow replacing lesion suggestive of neoplasm. Paranasal sinuses: Imaged portions clear. Brain: Previously seen ill-defined enhancing tissue within the bilateral cavernous sinuses has essentially resolved and is favored to represent response to therapy in this patient with a diagnosis of [name] syndrome. Mild asymmetric prominence of the right 3rd cranial nerve may reflect residual inflammation. Similar atrophy of the right optic nerve. No evidence of an acute abnormality. No significant white matter disease or acute ischemia. No mass effect, hemorrhage, or hydrocephalus. No abnormal enhancement to suggest neoplasm, abscess, or mass lesion. Grossly normal flow voids in the major intracranial arteries and dural venous sinuses. Additional comments: Multilevel spondylosis of the imaged cervical spine.   REVIEW OF SYSTEMS: Out of a complete 14 system review of symptoms, the patient complains only of the following symptoms, headaches, eye pain and all other reviewed systems are negative.   ALLERGIES: Allergies  Allergen Reactions   Multihance [Gadobenate] Nausea And Vomiting    Excessive vomiting after injection on 2 mri studies..   Alphagan P [Brimonidine Tartrate] Other (See Comments)    Makes eye red   Lipitor [Atorvastatin] Other (See Comments)    Joint pain; myalgias      HOME MEDICATIONS: Outpatient Medications Prior to Visit  Medication Sig Dispense Refill   aspirin 81 MG tablet Take 81 mg by mouth daily.     atenolol (TENORMIN) 25 MG tablet Take 25 mg by mouth daily.     benzonatate (TESSALON) 100 MG capsule Take 1 capsule (100 mg total) by mouth every 8 (eight) hours. 21 capsule 0   co-enzyme Q-10  30 MG capsule Take 30 mg by mouth daily as needed (with joint pain).     dorzolamide-timolol (COSOPT) 22.3-6.8 MG/ML ophthalmic solution Place 1 drop into both eyes 2 (two) times daily.     ezetimibe (ZETIA) 10 MG tablet Take 10 mg by mouth every evening.     HYDROcodone-acetaminophen (NORCO/VICODIN) 5-325 MG per tablet Take 1 tablet by mouth as needed.  0   HYDROcodone-homatropine (HYCODAN) 5-1.5 MG/5ML syrup Take 5 mLs by mouth every 6 (six) hours as needed for cough. 120 mL 0   ibuprofen (ADVIL,MOTRIN) 200 MG tablet Take 400 mg by mouth every 6 (six) hours as needed for headache.     LORazepam (ATIVAN) 0.5 MG tablet Take 1 tablet (0.5 mg total) by mouth every 8 (eight) hours as needed for anxiety. 30 tablet 0   LUMIGAN 0.01 % SOLN Place 1 drop into both eyes daily. At bedtime     Multiple Vitamin (MULTIVITAMIN WITH MINERALS) TABS tablet Take 1 tablet by mouth daily.     Omega-3 Fatty Acids (FISH OIL) 1200 MG CAPS Take 1 capsule by mouth daily.     predniSONE (DELTASONE) 5 MG tablet TAKE 1 TABLET BY MOUTH DAILY AS NEEDED 30 tablet 1   No facility-administered medications prior to visit.  PAST MEDICAL HISTORY: Past Medical History:  Diagnosis Date   Anxiety    Arthritis    Back pain    Fragments in vertebrae    Elevated cholesterol    Glaucoma    right worse than left   High cholesterol    Hypertension    [name] syndrome      PAST SURGICAL HISTORY: Past Surgical History:  Procedure Laterality Date   KNEE ARTHROSCOPY Left 1984     FAMILY HISTORY: Family History  Problem Relation Age of Onset   Glaucoma Mother    Pancreatic cancer Mother    Glaucoma Father      SOCIAL HISTORY: Social History   Socioeconomic History   Marital status: Married    Spouse name: Not on file   Number of children: 4   Years of education: Bachelor's   Highest education level: Not on file  Occupational History   Occupation: Occupational psychologist [employer]  Tobacco Use   Smoking status:  Former    Years: 10.00    Types: Cigarettes    Quit date: 09/25/1976    Years since quitting: 44.6   Smokeless tobacco: Never  Vaping Use   Vaping Use: Never used  Substance and Sexual Activity   Alcohol use: Yes    Comment: socially   Drug use: No   Sexual activity: Not on file  Other Topics Concern   Not on file  Social History Narrative   Lives at home with wife.   Right handed.   Caffeine use: 1 cup coffee per day.   Occasionally drinks soda and/or tea    Social Determinants of Health   Financial Resource Strain: Not on file  Food Insecurity: Not on file  Transportation Needs: Not on file  Physical Activity: Not on file  Stress: Not on file  Social Connections: Not on file  Intimate Partner Violence: Not on file     PHYSICAL EXAM  Vitals:   05/26/21 1111  BP: (!) 152/92  Pulse: 62  Weight: 189 lb (85.7 kg)  Height: '5\' 11"'$  (1.803 m)   Body mass index is 26.36 kg/m.   Generalized: Well developed, in no acute distress  Cardiology: normal rate and rhythm, no murmur auscultated  Respiratory: clear to auscultation bilaterally    Neurological examination  Mentation: Alert oriented to time, place, history taking. Follows all commands speech and language fluent Cranial nerve II-XII: Pupils were equal round reactive to light. Extraocular movements were full, visual field were full on confrontational test. Facial sensation and strength were normal. Head turning and shoulder shrug  were normal and symmetric. Motor: The motor testing reveals 5 over 5 strength of all 4 extremities. Good symmetric motor tone is noted throughout.  Sensory: Sensory testing is intact to soft touch on all 4 extremities. No evidence of extinction is noted.  Gait and station: Gait is normal.    DIAGNOSTIC DATA (LABS, IMAGING, TESTING) - I reviewed patient records, labs, notes, testing and imaging myself where available.  Lab Results  Component Value Date   WBC 6.8 07/17/2017   HGB 14.3  07/17/2017   HCT 44.3 07/17/2017   MCV 97 07/17/2017   PLT 243 07/17/2017      Component Value Date/Time   NA 140 07/17/2017 1025   K 4.6 07/17/2017 1025   CL 100 07/17/2017 1025   CO2 24 07/17/2017 1025   GLUCOSE 138 (H) 07/17/2017 1025   GLUCOSE 102 (H) 12/29/2014 1347   BUN 10 07/17/2017 1025  CREATININE 1.02 07/17/2017 1025   CALCIUM 9.9 07/17/2017 1025   PROT 7.0 07/17/2017 1025   ALBUMIN 4.1 07/17/2017 1025   AST 15 07/17/2017 1025   ALT 19 07/17/2017 1025   ALKPHOS 53 07/17/2017 1025   BILITOT 0.4 07/17/2017 1025   GFRNONAA 76 07/17/2017 1025   GFRAA 88 07/17/2017 1025   No results found for: CHOL, HDL, LDLCALC, LDLDIRECT, TRIG, CHOLHDL No results found for: HGBA1C Lab Results  Component Value Date   VITAMINB12 1,091 (H) 01/07/2015   Lab Results  Component Value Date   TSH 0.347 (L) 12/29/2014    No flowsheet data found.   No flowsheet data found.   ASSESSMENT AND PLAN  70 y.o. year old male  has a past medical history of Anxiety, Arthritis, Back pain, Elevated cholesterol, Glaucoma, High cholesterol, Hypertension, and [name] syndrome. here with    [name] syndrome - Plan: MR BRAIN W WO CONTRAST, MR ORBITS W WO CONTRAST  Intermittent headache  Pain of both eyes  Kameron has experienced worsening headaches and eye pain, R>L for the past month. Symptoms consistent with previous exacerbations. We will start prednisone '5mg'$  daily for 5-10 days. He will discontinue once symptoms are resolved. No other red flag warnings, today. I will order MRI brain and orbits to monitor for any significant progression. Last imaging in 2018 showed chronic microvascular ischemia, asymmetric enhancement of right cavernous sinus and atrophy of right optic nerve. Fortunately, he has not experienced worsening double vision. He will continue working closely with PCP for comorbidity management. He has follow up with Dr Jaynee Eagles in November that he wishes to keep at this time.  May consider rescheduling pending MRI results if he is doing well. He verbalizes understanding and agreement with this plan.   Orders Placed This Encounter  Procedures   MR BRAIN W WO CONTRAST    Standing Status:   Future    Standing Expiration Date:   05/26/2022    Order Specific Question:   If indicated for the ordered procedure, I authorize the administration of contrast media per Radiology protocol    Answer:   Yes    Order Specific Question:   What is the patient's sedation requirement?    Answer:   No Sedation    Order Specific Question:   Does the patient have a pacemaker or implanted devices?    Answer:   No    Order Specific Question:   Preferred imaging location?    Answer:   External   MR ORBITS W WO CONTRAST    Standing Status:   Future    Standing Expiration Date:   05/26/2022    Order Specific Question:   GRA to provide read?    Answer:   Yes    Order Specific Question:   If indicated for the ordered procedure, I authorize the administration of contrast media per Radiology protocol    Answer:   Yes    Order Specific Question:   What is the patient's sedation requirement?    Answer:   No Sedation    Order Specific Question:   Does the patient have a pacemaker or implanted devices?    Answer:   No    Order Specific Question:   Preferred imaging location?    Answer:   External      Meds ordered this encounter  Medications   predniSONE (DELTASONE) 5 MG tablet    Sig: Take 1 tablet (5 mg total) by mouth daily as needed.  Dispense:  30 tablet    Refill:  1    Order Specific Question:   Supervising Provider    Answer:   Melvenia Beam [id number]       Debbora Presto, MSN, FNP-C 05/26/2021, 1:04 PM  [facility] [address], [place], [place] [address] [phone]

## 2021-05-26 NOTE — Patient Instructions (Signed)
Below is our plan:  We will refill prednisone '5mg'$  tablet. Please take 1 tablet day as needed for headache/eye pressure.   Please make sure you are staying well hydrated. I recommend 50-60 ounces daily. Well balanced diet and regular exercise encouraged. Consistent sleep schedule with 6-8 hours recommended.   Please continue follow up with care team as directed.   Follow up with Dr Jaynee Eagles in November  You may receive a survey regarding today's visit. I encourage you to leave honest feed back as I do use this information to improve patient care. Thank you for seeing me today!

## 2021-06-02 ENCOUNTER — Telehealth: Payer: Self-pay | Admitting: Family Medicine

## 2021-06-02 DIAGNOSIS — H4940 Progressive external ophthalmoplegia, unspecified eye: Secondary | ICD-10-CM

## 2021-06-02 DIAGNOSIS — H4942 Progressive external ophthalmoplegia, left eye: Secondary | ICD-10-CM

## 2021-06-02 NOTE — Telephone Encounter (Signed)
[employer] medicare auth: [id number] (exp. 06/02/21 to 07/01/21) order sent to [facility]. They will reach out to the patient to schedule.

## 2021-06-06 NOTE — Addendum Note (Signed)
Addended by: Debbora Presto L on: 06/06/2021 03:55 PM   Modules accepted: Orders

## 2021-06-06 NOTE — Telephone Encounter (Signed)
I have update MRI to without contrast. I am unable to cancel previous order. Please let them know ok to proceed without.

## 2021-06-06 NOTE — Telephone Encounter (Signed)
Todd [name] with [facility] imaging sent me an email.  "[name] has nausea and vomiting when he receives contrast dye, he wants to know if we can do without. If so will you update the orders to wo contrast or let us know how you would like to proceed, please."

## 2021-06-07 NOTE — Telephone Encounter (Signed)
Your welcome.

## 2021-06-07 NOTE — Addendum Note (Signed)
Addended byUbaldo Glassing, Zynia Wojtowicz L on: 06/07/2021 07:09 AM   Modules accepted: Orders

## 2021-06-07 NOTE — Telephone Encounter (Signed)
Noted, I sent Olin Hauser a message to go ahead and schedule the MRI without contrast. They will reach out to the patient to schedule.

## 2021-06-15 ENCOUNTER — Ambulatory Visit
Admission: RE | Admit: 2021-06-15 | Discharge: 2021-06-15 | Disposition: A | Discharge status: Home / Self Care | Payer: Medicare Other | Source: Ambulatory Visit | Attending: Family Medicine | Admitting: Family Medicine

## 2021-06-15 ENCOUNTER — Other Ambulatory Visit: Payer: Self-pay

## 2021-06-15 DIAGNOSIS — H4940 Progressive external ophthalmoplegia, unspecified eye: Secondary | ICD-10-CM

## 2021-06-15 DIAGNOSIS — H4942 Progressive external ophthalmoplegia, left eye: Secondary | ICD-10-CM

## 2021-08-16 ENCOUNTER — Encounter: Payer: Self-pay | Admitting: Neurology

## 2021-08-16 ENCOUNTER — Ambulatory Visit: Payer: Medicare Other | Admitting: Neurology

## 2021-08-16 ENCOUNTER — Other Ambulatory Visit: Payer: Self-pay

## 2021-08-16 VITALS — Ht 71.0 in | Wt 191.2 lb

## 2021-08-16 DIAGNOSIS — H4940 Progressive external ophthalmoplegia, unspecified eye: Secondary | ICD-10-CM

## 2021-08-16 MED ORDER — PREDNISONE 5 MG PO TABS: 5.0000 mg | ORAL_TABLET | Freq: Every day | ORAL | 4 refills | Status: DC

## 2021-08-16 NOTE — Progress Notes (Signed)
[facility]    Provider:  Dr Todd [name] Referring Provider: Mayra Neer, MD Primary Care Physician:  Todd Neer, MD  CC:  Todd [name]  Interval history 08/16/2021: No double vision currently. Just some pressure behind the eyes. Pressure happens every once in a while, more on the left, he had a lens implant on the right eye but it did not help the vision, no worsening vision on the left eye, no double vision. 1-2 weeks he gets some pressure behind is eyes for one hour, in the temple area, again no double vision, no vision changes, no headache, bilateral but maybe more on the left side. He saw his eye doctor about a year ago, recommended seeing his eye doctor again. He ran out of steroids, he was on 46m a day, he thinks being off of the steroids has csaused the pressure. If not improved with the steroids, will repeat the imaging.    MRI orbits 06/17/2021 wo contrast: IMPRESSION: Abnormal MRI scan of the orbits without contrast showing atrophy of the right optic nerve and incidental changes of chronic paranasal sinusitis.  Probably no significant change compared with previous MRI orbits from 08/02/2017 though contrast could not be administered during the current scan  MRI brain wo contrast: 06/17/2021 MRI scan of the brain without contrast showing mild changes of chronic small vessel disease which is age-appropriate and atrophy of the right optic nerve.  As compared with previous MRI from 08/02/2017 with age-related small vessel disease changes are expectedly slightly progressed.  Interval history 10/15/2018: This is a lovely patient here for follow-up of Todd [name].  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  Today I reviewed literature with patient.  If left untreated symptoms may resolve spontaneously after about 8 weeks.  Steroids can help with the pain  but unclear if it helps with the duration of ophthalmoplegia.  Little consideration has been given to alternative therapies usually because the rapid response of pain within 24 to 72 hours.  It is rare that individuals may need other immunosuppressive medications and because it is rare and not many patients need them, I have not been able to find any clinical trials for immunosuppressant therapy.  According to and NORD the [employer] rare diseases, this is a rare disorder and usually this is treated with steroids. Discussed risks of long term steroids.   He has used them "every once in a while" he says the pain cam eon back in November/december. He can feel it coming again. So he will take 557mtwice a week. He is following his glaucoma, is blood glucose, his BP as steroids can worsen all of this, recommended calcium and Vitamin D. Discussed taking the medication as little as possible and with severe flair and higher doses needed call the office.   Interval history; he was on 4m34maily and symptoms returned, headache, right eye pain, diplopia. Likely again TolLisette Abuiscussed long-term effects of steroids and changing to Rituximab.    Interval history 01/15/2017: He is taking 4mg37mery other day of prednisone. He had another episode of diplopia and he increased his prednisone to 20mg17m a week and it improved.He feels numbness on his right side of his face that is variable with tingling in his right ear. That started with the TolosLisette Abull continue low-dose steroids 2.4mg e60my other day.  Discussed steroid sparing agents.Rituximab, cyclophosphamide, methotrexate and CellCept or  some of the agents that have been used successfully in Todd [name]. He declines any steroid. He was not taking his steroids consistently when he had the flare. Discussed trying 2.78m every other day and taking it consistently.    Interval history 07/18/2016: [facility] onc[facility]. There is no other cause found for his orbital inflammation, diagnosed with noninfectious orbital inflammation or Todd [name]. Patient has been in multiple rounds of steroids. Suggest steroid sparing agents given the side effects of long-term steroid use. Rituximab, cyclophosphamide, methotrexate and CellCept or some of the agents that have been used successfully in Todd [name].He is on prednisone 557mevery other day. Discussed steroid sparing agents. He declines at this time, would prefer to stay on low-dose steroids. Will try 2.42m58mvery other day. Steroids can worsen glaucoma so need to follow up with ophthalmologist and let him know about the steroids and discuss wit him. Discussed side effects of long-term steroids.      MRI [facility] 04/2016:  BRAIN Calvarium/skull base: No focal marrow replacing lesion suggestive of neoplasm. Paranasal sinuses: Imaged portions clear. Brain: Previously seen ill-defined enhancing tissue within the bilateral cavernous sinuses has essentially resolved and is favored to represent response to therapy in this patient with a diagnosis of Todd [name]. Mild asymmetric prominence of the right 3rd cranial nerve may reflect residual inflammation. Similar atrophy of the right optic nerve. No evidence of an acute abnormality. No significant white matter disease or acute ischemia. No mass effect, hemorrhage, or hydrocephalus. No abnormal enhancement to suggest neoplasm, abscess, or mass lesion. Grossly normal flow voids in the major intracranial arteries and dural venous sinuses. Additional comments: Multilevel spondylosis of the imaged cervical spine.   HPI:  Todd [name] a [age]o. male here as a referral from Dr. ShaBrigitte Pulser repeated episodes of painful ophthalmoplegia resolved with high dose steroids suspicious of Tolosa Hunt [name].    Patient is a lovely [age]ar old gentleman who is here for  another episode of diplopia this time with right eye ptosis as well. 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 He was originally seen by me in April 2016 for diplopia and left-sided eye pain of unknown etiology. He was having pain on the left side of the head, pain on eye movement, headache, cramping to the middle of the head without light sensitivity, no sound sensitivity, no nausea, no vomiting  He was evaluated inpatient and MRIs(MRI brain, MRI orbits, MRV) and labs(esr,crp) did not reveal etiology. 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  Further re-examination of MRI imaging of the orbits showed enhancing soft tissue mass at the left orbital apex, possibly suggesting  tolosa hunt. The symptoms resolved with a course of high-dose steroids.   Patient returned in October 2016 with similar problems in the opposite eye, the right eye. He described pain in the back of the right eye, right temple cramping pain, double vision, side by side, with both eyes open, pain on eye movement. He had throbbing pain behind the right eye. 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 MRI of the orbits and brain now showed "asymmetry of the cavernous sinuses due to enhancing soft tissue surrounding the right internal carotid artery. Combined with his clinical presentation, this is consistent with Todd [name].   This has occurred since the MRI dated 12/29/2014. At that time, there appeared to be similar  changes involving the left cavernous sinus." Patient's symptoms again resolved with high-dose steroids. 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 Onset 10 days ago and progressive with vision changes(diplopia).Patient cannot afford anymore  imaging. He describes the same symptoms of headaches, pressure behind the eyes, pain on eye movement, blurry and double vision and new right ptosis. No predilection for time of day.    Interval history 03/14/2016: Patient has been on a tapering course of steroids since last being seen. Started at 5m and last dose was a week ago and now he is starting to have the headache again. Will restart at 247mand taper more slowly. Today the 3rd nerve palsy is reolved.    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 Formal report: 1. 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 2. No other focal lesion of the orbits to explain diplopia. 3. Mild periventricular and subcortical T2 changes are slightly advanced for age. The finding is nonspecific but can be seen in the setting of chronic microvascular ischemia, a demyelinating process such as multiple sclerosis, vasculitis, complicated migraine headaches, or as the sequelae of a prior infectious or inflammatory process. 4. No focal pathology within the occipital cortex.   MRV 01/13/2015: Normal MRV head (without).   MRI of the brain and orbits 11/08/2015:   1.    Asymmetry of the cavernous sinuses due to enhancing soft tissue surrounding the right internal carotid artery. Combined with his clinical presentation, this is consistent with Todd [name].   This has occurred since the MRI dated 12/29/2014. At that time, there appeared to be similar changes involving the left cavernous sinus. 2.    A few scattered T2/FLAIR hyperintense foci consistent with mild chronic microvascular ischemic change.   This appears to have progressed slightly when compared to the prior MRI.   Serum labs unremarkable: TSH, crp, sed rate, cbc, cmp, RF, ANA, RPR, CK, AchR antibodies, NMO,  Lyme, pan-anca, IFE, paraneoplastic panel, B12, folate, HIV, Ace   CSF unremarkable: cytology,IGG index, VDRL, oligoclonal bands, ace, , glucose, cell count and diff, tuberculosis, fungus, gram stain, culture.   CSF mild increased protein 62.  Review of Systems: Patient complains of symptoms per HPI as well as the following symptoms: eye pressure . Pertinent negatives and positives per HPI. All others negative    Social History   Socioeconomic History   Marital status: Married    Spouse name: Not on file   Number of children: 4   Years of education: Bachelor's   Highest education level: Not on file  Occupational History   Occupation: [employer]  Tobacco Use   Smoking status: Former    Years: 10.00    Types: Cigarettes    Quit date: 09/25/1976    Years since quitting: 44.9   Smokeless tobacco: Never  Vaping Use   Vaping Use: Never used  Substance and Sexual Activity   Alcohol use: Yes    Comment: socially   Drug use: No   Sexual activity: Not on file  Other Topics Concern   Not on file  Social History Narrative   Lives at home with wife.   Right handed.   Caffeine use: 1 cup coffee per day.   Occasionally drinks soda and/or tea    Social Determinants of Health   Financial Resource Strain: Not on file  Food Insecurity: Not on file  Transportation Needs: Not on file  Physical Activity: Not on file  Stress: Not on file  Social Connections: Not on file  Intimate Partner Violence: Not on file    Family History  Problem Relation Age of Onset   Glaucoma Mother    Pancreatic cancer Mother    Glaucoma Father    Migraines Neg Hx    Headache Neg Hx     Past Medical History:  Diagnosis Date   Anxiety    Arthritis    Back pain    Fragments in vertebrae    Elevated cholesterol    Glaucoma    right worse than left   High cholesterol    Hypertension    Todd [name]     Past Surgical History:  Procedure Laterality Date   KNEE ARTHROSCOPY Left  1984    Current Outpatient Medications  Medication Sig Dispense Refill   aspirin 81 MG tablet Take 81 mg by mouth daily.     atenolol (TENORMIN) 25 MG tablet Take 25 mg by mouth daily.     benzonatate (TESSALON) 100 MG capsule Take 1 capsule (100 mg total) by mouth every 8 (eight) hours. 21 capsule 0   co-enzyme Q-10 30 MG capsule Take 30 mg by mouth daily as needed (with joint pain).     dorzolamide-timolol (COSOPT) 22.3-6.8 MG/ML ophthalmic solution Place 1 drop into both eyes 2 (two) times daily.     ezetimibe (ZETIA) 10 MG tablet Take 10 mg by mouth every evening.     HYDROcodone-acetaminophen (NORCO/VICODIN) 5-325 MG per tablet Take 1 tablet by mouth as needed.  0   HYDROcodone-homatropine (HYCODAN) 5-1.5 MG/5ML syrup Take 5 mLs by mouth every 6 (six) hours as needed for cough. 120 mL 0   ibuprofen (ADVIL,MOTRIN) 200 MG tablet Take 400 mg by mouth every 6 (six) hours as needed for headache.     LORazepam (ATIVAN) 0.5 MG tablet Take 1 tablet (0.5 mg total) by mouth every 8 (eight) hours as needed for anxiety. 30 tablet 0   LUMIGAN 0.01 % SOLN Place 1 drop into both eyes daily. At bedtime     Multiple Vitamin (MULTIVITAMIN WITH MINERALS) TABS tablet Take 1 tablet by mouth daily.     Omega-3 Fatty Acids (FISH OIL) 1200 MG CAPS Take 1 capsule by mouth daily.     predniSONE (DELTASONE) 5 MG tablet Take 1 tablet (5 mg total) by mouth daily as needed. 30 tablet 1   predniSONE (DELTASONE) 5 MG tablet Take 1 tablet (5 mg total) by mouth daily with breakfast. 90 tablet 4   No current facility-administered medications for this visit.    Allergies as of 08/16/2021 - Review Complete 08/16/2021  Allergen Reaction Noted   Multihance [gadobenate] Nausea And Vomiting 08/02/2017   Alphagan p [brimonidine tartrate] Other (See Comments) 12/29/2014   Lipitor [atorvastatin] Other (See Comments) 01/04/2015    Vitals: BP 132/82 77 standing, 134/80 63 sitting, 134/77 59 laying Ht _0  (1.803 m)   Wt  191 lb 3.2 oz (86.7 kg)   BMI 26.67 kg/m  Last Weight:  Wt Readings from Last 1 Encounters:  08/16/21 191 lb 3.2 oz (86.7 kg)   Last Height:  Ht Readings from Last 1 Encounters:  08/16/21 _0  (1.803 m)    Neuro: Detailed Neurologic Exam   Speech:    Speech is normal; fluent and spontaneous with normal comprehension.   Cognition:    The patient is oriented to person, place, and time;      recent and remote memory intact;      language fluent;      normal attention, concentration,      fund of knowledge Cranial Nerves: Stable    (Chronic right eye glaucoma) Right pupil unreactive and 2-42m, right optic nerve pallor and atrophy, right APD(all chronic due to Glaucoma). Left pupil pinpoint. EOMI. Visual fields are limited in the right eye due to severe chronic glaucoma. VF intact left eye. Trigeminal face sensation is intact and the muscles of mastication are normal. The face is otherwise symmetric. The palate elevates in the midline. Hearing intact. Voice is normal. Shoulder shrug is normal. The tongue has normal motion without fasciculations.    Coordination:    Normal    Gait:    normal.     Motor Observation:    No asymmetry, no atrophy, and no involuntary movements noted. Tone:    Normal muscle tone.     Posture:    Posture is normal. normal erect     Strength:    Strength is V/V in the upper and lower limbs.        Sensation: intact to LT       Assessment/Plan:  Todd SHARPSis a lovely 69y.o. male here as a referral from Dr. SBrigitte Pulsefor repeated episodes of painful ophthalmoplegia resolved with high dose steroids. 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 Episodes in the past resolved with steroids.Review of MRI or the orbits have shown enhancing soft tissue mass at the orbital apex unilateral to symptoms, suggesting  tolosa hunt.  Extensive workup, imaging, labs, csf as above.  [facility]. There is no other cause found for his orbital inflammation, diagnosed with noninfectious orbital inflammation or Todd [name].   - Today his EOMI, just feeling some "pressure" , here for refill of steroids. Can try 530mprn (see below) and follow clinically. F/u one year.   - has right-sided lens replacement about a year ago, repeat. He states he had labs recently with Dr. ShBrigitte Pulsencluding [id number], we will defer labs. If symptoms worsen, contact usKoreand recommend repeat MRI brain and orbits w/wo contrast. Also have f/u with ophthalmology and follow closely with Dr. ShBrigitte Pulseor diabetes and counseling on calcium and vitamin D replacement which I recommend and can get OTC, watch for GI upset, dark stools.   - This is a lovely patient here for follow-up of Todd [name].  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  Today I reviewed literature with patient.  If left untreated symptoms may resolve spontaneously after about 8 weeks.  Steroids can help with the pain but unclear if it helps with the duration of ophthalmoplegia.  Little consideration has been given to alternative therapies usually because the rapid response of pain within 24 to 72 hours. He has also declined any other agents and prefers steroids prn.     -Discussed steroid sparing agents.Rituximab, cyclophosphamide, methotrexate and CellCept or some of the agents that  have been used successfully in Todd [name]. He declines.   - Evaluated at [facility]mology and oncology/hematology, concurs with diagnosis of Todd [name]   - Disucssed risks of long-term steroids  Cc: [address] [phone] fax [phone], Dr. Brigitte Pulse   I spent 20 minutes of face-to-face and non-face-to-face time with  patient on the  1. Todd [name]    diagnosis.  This included previsit chart review, lab review, study review, order entry, electronic health record documentation, patient education on the different diagnostic and therapeutic options, counseling and coordination of care, risks and benefits of management, compliance, or risk factor reduction   Meds ordered this encounter  Medications   predniSONE (DELTASONE) 5 MG tablet    Sig: Take 1 tablet (5 mg total) by mouth daily with breakfast.    Dispense:  90 tablet    Refill:  Lueders, MD  [facility] [address] [place], [place] [address]  Phone [phone] Fax [phone]

## 2022-02-13 ENCOUNTER — Other Ambulatory Visit: Payer: Self-pay | Admitting: General Surgery

## 2022-06-05 ENCOUNTER — Other Ambulatory Visit: Payer: Self-pay | Admitting: Neurology

## 2022-06-12 ENCOUNTER — Telehealth: Payer: Self-pay | Admitting: Neurology

## 2022-06-12 NOTE — Telephone Encounter (Signed)
Pt request refill for predniSONE (DELTASONE) 5 MG tablet at [place]

## 2022-06-12 NOTE — Telephone Encounter (Signed)
I called pt after speaking with pharmacy.  Pt went to [facility] over Labor Day and left his bottle of prednisone there.  He has not taken since 05-26-2022.  He has nother refill available insurance saying to soon.  I relayed that he may get fill and pay OOP, check with pharmacy and see what he would have to pay.  His next prescription due 07-18-2022.  I would see if ok to fill early and possible overide the script.  He appreciated the information.

## 2022-06-13 NOTE — Telephone Encounter (Addendum)
Spoke with [employer]. Insurance will not fill until 06/20/22. Pt can fill early by paying cash $25. If patient prefers to pay less he can fill a week's worth until insurance will pick up the rest. I called the patient and let him know. He was very Patent attorney. He will call the pharmacy and let them know how much he wants to fill now.

## 2022-07-12 ENCOUNTER — Ambulatory Visit: Payer: Medicare Other | Admitting: Neurology

## 2022-07-12 ENCOUNTER — Encounter: Payer: Self-pay | Admitting: Neurology

## 2022-07-12 ENCOUNTER — Telehealth: Payer: Self-pay | Admitting: Neurology

## 2022-07-12 VITALS — BP 119/79 | HR 60 | Ht 71.0 in | Wt 189.8 lb

## 2022-07-12 DIAGNOSIS — Z7952 Long term (current) use of systemic steroids: Secondary | ICD-10-CM | POA: Diagnosis not present

## 2022-07-12 DIAGNOSIS — H4940 Progressive external ophthalmoplegia, unspecified eye: Secondary | ICD-10-CM | POA: Diagnosis not present

## 2022-07-12 MED ORDER — PREDNISONE 10 MG PO TABS: ORAL_TABLET | ORAL | 3 refills | Status: DC

## 2022-07-12 MED ORDER — PREDNISONE 5 MG PO TABS: 5.0000 mg | ORAL_TABLET | Freq: Every day | ORAL | 4 refills | Status: DC

## 2022-07-12 NOTE — Telephone Encounter (Signed)
Referral for Neurology faxed to [facility]. Phone: [phone], Fax: [phone]

## 2022-07-12 NOTE — Patient Instructions (Addendum)
  Meds ordered this encounter  Medications   predniSONE (DELTASONE) 10 MG tablet    Sig: For the next 14 days please take '10mg'$  of prednisone and then return to '5mg'$  daily dosing.    Dispense:  14 tablet    Refill:  3    He will increase prednisone to '10mg'$  for 2 weeks and then go back to daily '5mg'$  dosing. Will send in 2 prescriptions.   predniSONE (DELTASONE) 5 MG tablet    Sig: Take 1 tablet (5 mg total) by mouth daily with breakfast.    Dispense:  90 tablet    Refill:  4    He has 2 prescriptions. For the next 2 weeks will take '10mg'$  daily and then go back to '5mg'$  daily long term   Orders Placed This Encounter  Procedures   Vitamin D, 25-hydroxy   Talk to Dr. Brigitte Pulse about calcium and vitamin D due to long-term steroids Also I will recommend to Dr. Brigitte Pulse a Dexa scan as she sees clinically appropriate  Continue f/u with Dr. Brigitte Pulse for diabetes and other long-term side effects of steroids

## 2022-07-12 NOTE — Progress Notes (Signed)
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  We reviewed literature with patient in the past.  If left untreated symptoms may resolve spontaneously after about 8 weeks.  Steroids can help with the pain but unclear if it helps with the duration of ophthalmoplegia.  Little consideration has been given to alternative therapies usually because the rapid response of pain within 24 to 72 hours.  It is rare that individuals may need other immunosuppressive medications and because it is rare and not many patients need them, I have not been able to find any clinical trials for immunosuppressant therapy.  According to and NORD the [employer] of rare diseases, this is a rare disorder and usually this is treated with steroids. Discussed risks of long term steroids.   he is here today for follow up, having pressure and symptoms in the left eye again, the last time that happened was last year and feels it coming on again the pressure that prescedes the double vision. He takes steroids 53m continuously and sometimes we increase the dose when having the diplopia. We have extensively tested him for causes, send him to multiple specialists, we just do not know why he continues to have eye symptoms that change from one eye to the other.  Interval history 08/16/2021: No double vision currently. Just some pressure behind the eyes. Pressure happens every once in a while, more on the left, he had a lens implant on the right eye but it did not help the vision, no worsening vision on the left eye, no double vision. 1-2 weeks he gets some pressure behind  is eyes for one hour, in the temple area, again no double vision, no vision changes, no headache, bilateral but maybe more on the left side. He saw his eye doctor about a year ago, recommended seeing his eye doctor again. He ran out of steroids, he was on 584ma day, he thinks being off of the steroids has csaused the pressure. If not improved with the steroids, will repeat the imaging.    MRI orbits 06/17/2021 wo contrast: IMPRESSION: Abnormal MRI scan of the orbits without contrast showing atrophy of the right optic nerve and incidental changes of chronic paranasal sinusitis.  Probably no significant change compared with previous MRI orbits from 08/02/2017 though contrast could not be administered during the current scan  MRI brain wo contrast: 06/17/2021 MRI scan of the brain without contrast showing mild changes of chronic small vessel disease which is age-appropriate and atrophy of the right optic nerve.  As compared with previous MRI from 08/02/2017 with age-related small vessel disease changes are expectedly slightly progressed.  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  Today I reviewed literature with patient.  If left untreated symptoms may resolve spontaneously after about 8 weeks.  Steroids can help with the pain but unclear if it helps with the duration of ophthalmoplegia.  Little consideration has been given to alternative therapies usually because the rapid response of pain within 24 to  72 hours.  It is rare that individuals may need other immunosuppressive medications and because it is rare and not many patients need them, I have not been able to find any clinical trials for immunosuppressant therapy.  According to and NORD the [employer] of rare diseases, this is a rare  disorder and usually this is treated with steroids. Discussed risks of long term steroids.   He has used them "every once in a while" he says the pain cam eon back in November/december. He can feel it coming again. So he will take 8m twice a week. He is following his glaucoma, is blood glucose, his BP as steroids can worsen all of this, recommended calcium and Vitamin D. Discussed taking the medication as little as possible and with severe flair and higher doses needed call the office.   Interval history; he was on 5144mdaily and symptoms returned, headache, right eye pain, diplopia. Likely again ToLisette AbuDiscussed long-term effects of steroids and changing to Rituximab.    Interval history 01/15/2017: He is taking 44m44mvery other day of prednisone. He had another episode of diplopia and he increased his prednisone to 32m79mr a week and it improved.He feels numbness on his right side of his face that is variable with tingling in his right ear. That started with the ToloLisette Abu'll continue low-dose steroids 2.44mg 2mry other day.  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 He declines any steroid. He was not taking his steroids consistently when he had the flare. Discussed trying 2.44mg e53my other day and taking it consistently.    Interval history 07/18/2016: [facility] as well as hematology oncology. There is no other cause found for his orbital inflammation, diagnosed with noninfectious orbital inflammation or [name] syndrome. Patient has been in multiple rounds of steroids. Suggest steroid sparing agents given the side effects of long-term steroid use. Rituximab, cyclophosphamide, methotrexate and CellCept or some of the agents that have been used successfully in [name] syndrome.He is on prednisone 44mg ev52m other  day. Discussed steroid sparing agents. He declines at this time, would prefer to stay on low-dose steroids. Will try 2.44mg eve74mother day. Steroids can worsen glaucoma so need to follow up with ophthalmologist and let him know about the steroids and discuss wit him. Discussed side effects of long-term steroids.      MRI [facility] 04/2016:  BRAIN Calvarium/skull base: No focal marrow replacing lesion suggestive of neoplasm. Paranasal sinuses: Imaged portions clear. Brain: Previously seen ill-defined enhancing tissue within the bilateral cavernous sinuses has essentially resolved and is favored to represent response to therapy in this patient with a diagnosis of [name] syndrome. Mild asymmetric prominence of the right 3rd cranial nerve may reflect residual inflammation. Similar atrophy of the right optic nerve. No evidence of an acute abnormality. No significant white matter disease or acute ischemia. No mass effect, hemorrhage, or hydrocephalus. No abnormal enhancement to suggest neoplasm, abscess, or mass lesion. Grossly normal flow voids in the major intracranial arteries and dural venous sinuses. Additional comments: Multilevel spondylosis of the imaged cervical spine.   HPI:  ElnathanSHAMIR TUZZOLINO y.o. 52le here as a referral from Dr. Shaw forBrigitte Pulseeated episodes of painful ophthalmoplegia resolved with high dose steroids suspicious of Tolosa Hunt Syndrome.    Patient is a lovely 64 year 23d gentleman who is here for another episode  of diplopia this time with right eye ptosis as well. 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 He was originally seen by me in April 2016 for diplopia and left-sided eye pain of unknown etiology. He was having pain on the left side of the head, pain on eye movement, headache, cramping to the middle of the head without light sensitivity, no sound sensitivity, no nausea, no vomiting  He was evaluated inpatient and MRIs(MRI brain,  MRI orbits, MRV) and labs(esr,crp) did not reveal etiology. 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  Further re-examination of MRI imaging of the orbits showed enhancing soft tissue mass at the left orbital apex, possibly suggesting  tolosa hunt. The symptoms resolved with a course of high-dose steroids.   Patient returned in October 2016 with similar problems in the opposite eye, the right eye. He described pain in the back of the right eye, right temple cramping pain, double vision, side by side, with both eyes open, pain on eye movement. He had throbbing pain behind the right eye. 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 MRI of the orbits and brain now showed "asymmetry of the cavernous sinuses due to enhancing soft tissue surrounding the right internal carotid artery. 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   This has occurred since the MRI dated 12/29/2014. At that time, there appeared to be similar changes involving the left cavernous sinus." Patient's symptoms again resolved with high-dose steroids. 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 Onset 10 days ago and progressive with vision changes(diplopia).Patient cannot afford anymore imaging. He describes the same symptoms of headaches, pressure behind the eyes, pain on eye movement, blurry and double vision and new right ptosis. No predilection for time of day.    Interval history 03/14/2016: Patient has been on a tapering course of steroids since last being seen. Started at 75m and last dose was a week ago and now he is starting to have the headache again. Will restart at 27mand taper more slowly. Today the 3rd nerve palsy is reolved.    Imaging and  Labwork:   MR Brain and Orbits 12/29/2014: Personal examination of MRI or the orbits may show enhancing soft tissue mass at the left orbital apex, possibly suggesting  tolosa hunt. Formal report: 1. 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 2. No other focal lesion of the orbits to explain diplopia. 3. Mild periventricular and subcortical T2 changes are slightly advanced for age. The finding is nonspecific but can be seen in the setting of chronic microvascular ischemia, a demyelinating process such as multiple sclerosis, vasculitis, complicated migraine headaches, or as the sequelae of a prior infectious or inflammatory process. 4. No focal pathology within the occipital cortex.   MRV 01/13/2015: Normal MRV head (without).   MRI of the brain and orbits 11/08/2015:   1.    Asymmetry of the cavernous sinuses due to enhancing soft tissue surrounding the right internal carotid artery. 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   This has occurred since the MRI dated 12/29/2014. At that time, there appeared to be similar changes involving the left cavernous  sinus. 2.    A few scattered T2/FLAIR hyperintense foci consistent with mild chronic microvascular ischemic change.   This appears to have progressed slightly when compared to the prior MRI.   Serum labs unremarkable: TSH, crp, sed rate, cbc, cmp, RF, ANA, RPR, CK, AchR antibodies, NMO, Lyme, pan-anca, IFE, paraneoplastic panel, B12, folate, HIV, Ace   CSF unremarkable: cytology,IGG index, VDRL, oligoclonal bands, ace, , glucose, cell count and diff, tuberculosis, fungus, gram stain, culture.   CSF mild increased protein 62.  Review of Systems: Patient complains of symptoms per HPI as well as the following symptoms: eye pressure . Pertinent negatives and positives per HPI. All others negative    Social History   Socioeconomic History   Marital  status: Married    Spouse name: Not on file   Number of children: 4   Years of education: Bachelor's   Highest education level: Not on file  Occupational History   Occupation: Occupational psychologist [employer]  Tobacco Use   Smoking status: Former    Years: 10.00    Types: Cigarettes    Quit date: 09/25/1976    Years since quitting: 45.8   Smokeless tobacco: Never  Vaping Use   Vaping Use: Never used  Substance and Sexual Activity   Alcohol use: Yes    Comment: occ   Drug use: No   Sexual activity: Not on file  Other Topics Concern   Not on file  Social History Narrative   Lives at home with wife.   Right handed.   Caffeine use: 1 cup coffee per day.   Occasionally drinks soda and/or tea    Social Determinants of Health   Financial Resource Strain: Not on file  Food Insecurity: Not on file  Transportation Needs: Not on file  Physical Activity: Not on file  Stress: Not on file  Social Connections: Not on file  Intimate Partner Violence: Not on file    Family History  Problem Relation Age of Onset   Glaucoma Mother    Pancreatic cancer Mother    Glaucoma Father    Migraines Neg Hx    Headache Neg Hx     Past Medical History:  Diagnosis Date   Anxiety    Arthritis    Back pain    Fragments in vertebrae    Elevated cholesterol    Glaucoma    right worse than left   High cholesterol    Hypertension    [name] syndrome     Past Surgical History:  Procedure Laterality Date   KNEE ARTHROSCOPY Left 1984    Current Outpatient Medications  Medication Sig Dispense Refill   aspirin 81 MG tablet Take 81 mg by mouth daily.     atenolol (TENORMIN) 25 MG tablet Take 25 mg by mouth daily.     benzonatate (TESSALON) 100 MG capsule Take 1 capsule (100 mg total) by mouth every 8 (eight) hours. 21 capsule 0   co-enzyme Q-10 30 MG capsule Take 30 mg by mouth daily as needed (with joint pain).     dorzolamide-timolol (COSOPT) 22.3-6.8 MG/ML ophthalmic solution Place 1 drop into  both eyes 2 (two) times daily.     ezetimibe (ZETIA) 10 MG tablet Take 10 mg by mouth every evening.     HYDROcodone-acetaminophen (NORCO/VICODIN) 5-325 MG per tablet Take 1 tablet by mouth as needed.  0   HYDROcodone-homatropine (HYCODAN) 5-1.5 MG/5ML syrup Take 5 mLs by mouth every 6 (six) hours as needed for cough. 120 mL  0   ibuprofen (ADVIL,MOTRIN) 200 MG tablet Take 400 mg by mouth every 6 (six) hours as needed for headache.     LORazepam (ATIVAN) 0.5 MG tablet Take 1 tablet (0.5 mg total) by mouth every 8 (eight) hours as needed for anxiety. 30 tablet 0   LUMIGAN 0.01 % SOLN Place 1 drop into both eyes daily. At bedtime     Multiple Vitamin (MULTIVITAMIN WITH MINERALS) TABS tablet Take 1 tablet by mouth daily.     Omega-3 Fatty Acids (FISH OIL) 1200 MG CAPS Take 1 capsule by mouth daily.     predniSONE (DELTASONE) 10 MG tablet For the next 14 days please take 56m of prednisone and then return to 570mdaily dosing. 14 tablet 3   UNABLE TO FIND Med Name: Tumreic powder 1/4 teaspoon daily     predniSONE (DELTASONE) 5 MG tablet Take 1 tablet (5 mg total) by mouth daily with breakfast. 90 tablet 4   No current facility-administered medications for this visit.    Allergies as of 07/12/2022 - Review Complete 07/12/2022  Allergen Reaction Noted   Multihance [gadobenate] Nausea And Vomiting 08/02/2017   Alphagan p [brimonidine tartrate] Other (See Comments) 12/29/2014   Lipitor [atorvastatin] Other (See Comments) 01/04/2015    Vitals: BP 132/82 77 standing, 134/80 63 sitting, 134/77 59 laying BP 119/79   Pulse 60   Ht 5' 11"  (1.803 m)   Wt 189 lb 12.8 oz (86.1 kg)   BMI 26.47 kg/m  Last Weight:  Wt Readings from Last 1 Encounters:  07/12/22 189 lb 12.8 oz (86.1 kg)   Last Height:   Ht Readings from Last 1 Encounters:  07/12/22 5' 11"  (1.803 m)    Neuro: Detailed Neurologic Exam   Speech:    Speech is normal; fluent and spontaneous with normal comprehension.   Cognition:     The patient is oriented to person, place, and time;      recent and remote memory intact;      language fluent;      normal attention, concentration,      fund of knowledge Cranial Nerves: Stable    (Chronic right eye glaucoma) Right pupil unreactive and 2-65m48mright optic nerve pallor and atrophy, right APD(all chronic due to Glaucoma). Left pupil pinpoint but no ONH edema appreciated. EOMI. Visual fields are limited in the right eye due to severe chronic glaucoma. VF intact left eye. Trigeminal face sensation is intact and the muscles of mastication are normal. The face is otherwise symmetric. The palate elevates in the midline. Hearing intact. Voice is normal. Shoulder shrug is normal. The tongue has normal motion without fasciculations.   Coordination:    Normal    Gait:    normal.     Motor Observation:    No asymmetry, no atrophy, and no involuntary movements noted. Tone:    Normal muscle tone.     Posture:    Posture is normal. normal erect     Strength:    Strength is V/V in the upper and lower limbs.        Sensation: intact to LT       Assessment/Plan:  ElnMOSES ODOHERTY a lovely 70y.o. male here as a referral from Dr. ShaBrigitte Pulser repeated episodes of painful ophthalmoplegia resolved with high dose steroids. 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 Episodes in the past resolved with steroids.Review of MRI or the orbits  have shown enhancing soft tissue mass at the orbital apex unilateral to symptoms, suggesting  tolosa hunt.  Extensive workup, imaging, labs, csf as above.  Patient was evaluated at [facility] by neuro-ophthalmology as well as hematology oncology. There is no other cause found for his orbital inflammation, diagnosed with noninfectious orbital inflammation or [name] syndrome.   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  He has had extensive work-up and  referrals to academic centers for episodic bilateral cavernous sinus inflammation causing severe headache and ophthalmoplegia.  Today I reviewed literature with patient.  If left untreated symptoms may resolve spontaneously after about 8 weeks.  Steroids can help with the pain but unclear if it helps with the duration of ophthalmoplegia.  Little consideration has been given to alternative therapies usually because the rapid response of pain within 24 to 72 hours. He has also declined any other agents and prefers steroids prn but now he would like to pursue another opinion and I highly recommend, we have found no other reason except "Tolosa Hunt".   - Today his EOMI, just feeling some "pressure" again , here for refill of steroids. He takes steroids 67m continuously and sometimes we increase the dose when having the diplopia (see below) and follow clinically. When this happens (last time a bout a year ago) higher doses of steroids resolve the issue.   - recommended referral to [facility] to see if there is anything else we can do, he agrees (he has been to onc/hemonc,neurooptho, [facility], feel [facility] is the next opinion)  - has right-sided lens replacement about a year ago, repeat. He states he had labs recently with Dr. SBrigitte Pulseincluding hgba1c, we will defer labs except check vit D. If symptoms worsen, contact [place] recommend repeat MRI brain and orbits w/wo contrast. Also have f/u with ophthalmology and follow closely with Dr. SBrigitte Pulsefor diabetes and counseling on calcium and vitamin D replacement due to the steroids which I recommend and can get OTC, watch for GI upset, dark stools.    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 He declined in the past but now is reconsidering, [facility]    [facility] concurs with  diagnosis of TLisette Abu had extensive bloodwork, LPs, imaging (see records and labs)   - Disucssed risks of long-term steroids.  Talk to Dr. SBrigitte Pulseabout calcium and vitamin D due to long-term steroids Also I will recommend to Dr. SBrigitte Pulsea Dexa scan as she sees clinically appropriate  Continue f/u with Dr. SBrigitte Pulsefor diabetes and other long-term side effects of steroids  Meds ordered this encounter  Medications   predniSONE (DELTASONE) 10 MG tablet    Sig: For the next 14 days please take 131mof prednisone and then return to 67m567maily dosing.    Dispense:  14 tablet    Refill:  3    He will increase prednisone to 95m23mr 2 weeks and then go back to daily 67mg 27ming. Will send in 2 prescriptions.   predniSONE (DELTASONE) 5 MG tablet    Sig: Take 1 tablet (5 mg total) by mouth daily with breakfast.    Dispense:  90 tablet    Refill:  4    He has 2 prescriptions. For the next 2 weeks will take 95mg 41my and then go back to 67mg da63m  long term    Orders Placed This Encounter  Procedures   Vitamin D, 25-hydroxy   Ambulatory referral to Neurology     Cc: [address] [phone] fax [phone], Dr. Brigitte Pulse   I spent 20 minutes of face-to-face and non-face-to-face time with patient on the  1. [name] syndrome   2. Long term (current) use of systemic steroids    diagnosis.  This included previsit chart review, lab review, study review, order entry, electronic health record documentation, patient education on the different diagnostic and therapeutic options, counseling and coordination of care, risks and benefits of management, compliance, or risk factor reduction   Meds ordered this encounter  Medications   predniSONE (DELTASONE) 10 MG tablet    Sig: For the next 14 days please take 53m of prednisone and then return to 567mdaily dosing.    Dispense:  14 tablet    Refill:  3    He will increase prednisone to 1039mor 2 weeks and then go back to  daily 5mg84msing. Will send in 2 prescriptions.   predniSONE (DELTASONE) 5 MG tablet    Sig: Take 1 tablet (5 mg total) by mouth daily with breakfast.    Dispense:  90 tablet    Refill:  4    He has 2 prescriptions. For the next 2 weeks will take 10mg32mly and then go back to 5mg d77my long term     AntoniSarina IllGuilfoWest [facility] [address] [phone] 30 minutes of face-to-face and non-face-to-face time with patient on the  1. [name] syndrome   2. Long term (current) use of systemic steroids    diagnosis.  This included previsit chart review, lab review, study review, order entry, electronic health record documentation, patient education on the different diagnostic and therapeutic options, counseling and coordination of care, risks and benefits of management, compliance, or risk factor reduction

## 2022-07-13 LAB — VITAMIN D 25 HYDROXY (VIT D DEFICIENCY, FRACTURES): Vit D, 25-Hydroxy: 17 ng/mL — ABNORMAL LOW (ref 30.0–100.0)

## 2022-07-28 ENCOUNTER — Other Ambulatory Visit: Payer: Self-pay | Admitting: Family Medicine

## 2022-07-28 DIAGNOSIS — Z7952 Long term (current) use of systemic steroids: Secondary | ICD-10-CM

## 2022-08-16 ENCOUNTER — Ambulatory Visit: Payer: Medicare Other | Admitting: Neurology

## 2022-09-07 ENCOUNTER — Other Ambulatory Visit: Payer: Medicare Other

## 2022-09-26 ENCOUNTER — Other Ambulatory Visit: Payer: Self-pay | Admitting: Neurology

## 2022-09-26 DIAGNOSIS — H4940 Progressive external ophthalmoplegia, unspecified eye: Secondary | ICD-10-CM

## 2022-10-26 ENCOUNTER — Ambulatory Visit
Admission: RE | Admit: 2022-10-26 | Discharge: 2022-10-26 | Disposition: A | Discharge status: Home / Self Care | Payer: Medicare Other | Source: Ambulatory Visit | Attending: Family Medicine | Admitting: Family Medicine

## 2022-10-26 DIAGNOSIS — Z7952 Long term (current) use of systemic steroids: Secondary | ICD-10-CM

## 2023-01-10 ENCOUNTER — Ambulatory Visit: Payer: Medicare Other | Admitting: Neurology

## 2023-02-06 ENCOUNTER — Ambulatory Visit: Payer: Medicare Other | Admitting: Neurology

## 2023-02-06 ENCOUNTER — Encounter: Payer: Self-pay | Admitting: Neurology

## 2023-02-06 VITALS — BP 128/76 | HR 75 | Ht 71.0 in | Wt 194.2 lb

## 2023-02-06 DIAGNOSIS — H5712 Ocular pain, left eye: Secondary | ICD-10-CM | POA: Diagnosis not present

## 2023-02-06 DIAGNOSIS — H539 Unspecified visual disturbance: Secondary | ICD-10-CM

## 2023-02-06 DIAGNOSIS — Z7952 Long term (current) use of systemic steroids: Secondary | ICD-10-CM

## 2023-02-06 DIAGNOSIS — H4940 Progressive external ophthalmoplegia, unspecified eye: Secondary | ICD-10-CM | POA: Diagnosis not present

## 2023-02-06 NOTE — Patient Instructions (Addendum)
[address] [place] [address] 2pm tomorrow Dexa scan  Increase dexa if needed

## 2023-02-06 NOTE — Progress Notes (Unsigned)
[facility]    Provider:  Dr Lucia [name] Referring Provider: [name], MD Primary Care Physician:  Todd Raider, MD   CC:  Todd [name]  Left eye pain 3 months ago, intermittent, he has dry eyes using refresh, no double vision but feeling like pressure in the eye. Stable. Just dry eye. Vision may be impaired but may need an upgrade on the glassess. Not pain but a heaviness, started 3-4 months ago hasn't worsened, no problems with color.   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  We reviewed literature with patient in the past.  If left untreated symptoms may resolve spontaneously after about 8 weeks.  Steroids can help with the pain but unclear if it helps with the duration of ophthalmoplegia.  Little consideration has been given to alternative therapies usually because the rapid response of pain within 24 to 72 hours.  It is rare that individuals may need other immunosuppressive medications and because it is rare and not many patients need them, I have not been able to find any clinical trials for immunosuppressant therapy.  According to and NORD the [employer] of rare diseases, this is a rare disorder and usually this is treated with steroids. Discussed risks of long term steroids.   he is here today for follow up, having pressure and symptoms in the left eye again, the last time that happened was last year and feels it coming on again the pressure that prescedes the double vision. He takes steroids 5mg  continuously and sometimes we increase the dose when having the diplopia. We have extensively tested him for causes, send him to multiple specialists, we just do not know why he continues to have eye symptoms that change from one eye to the  other.  Interval history 08/16/2021: No double vision currently. Just some pressure behind the eyes. Pressure happens every once in a while, more on the left, he had a lens implant on the right eye but it did not help the vision, no worsening vision on the left eye, no double vision. 1-2 weeks he gets some pressure behind is eyes for one hour, in the temple area, again no double vision, no vision changes, no headache, bilateral but maybe more on the left side. He saw his eye doctor about a year ago, recommended seeing his eye doctor again. He ran out of steroids, he was on 5mg  a day, he thinks being off of the steroids has csaused the pressure. If not improved with the steroids, will repeat the imaging.    MRI orbits 06/17/2021 wo contrast: IMPRESSION: Abnormal MRI scan of the orbits without contrast showing atrophy of the right optic nerve and incidental changes of chronic paranasal sinusitis.  Probably no significant change compared with previous MRI orbits from 08/02/2017 though contrast could not be administered during the current scan  MRI brain wo contrast: 06/17/2021 MRI scan of the brain without contrast showing mild changes of chronic small vessel disease which is age-appropriate and atrophy of the right optic nerve.  As compared with previous MRI from 08/02/2017 with age-related small vessel disease changes are expectedly slightly progressed.  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  He has had extensive work-up and referrals to academic centers for episodic bilateral cavernous sinus inflammation causing severe headache and  ophthalmoplegia.  Today I reviewed literature with patient.  If left untreated symptoms may resolve spontaneously after about 8 weeks.  Steroids can help with the pain but unclear if it helps with the duration of ophthalmoplegia.  Little consideration has been given to alternative therapies usually because the rapid response  of pain within 24 to 72 hours.  It is rare that individuals may need other immunosuppressive medications and because it is rare and not many patients need them, I have not been able to find any clinical trials for immunosuppressant therapy.  According to and NORD the [employer] of rare diseases, this is a rare disorder and usually this is treated with steroids. Discussed risks of long term steroids.   He has used them "every once in a while" he says the pain cam eon back in November/december. He can feel it coming again. So he will take 5mg  twice a week. He is following his glaucoma, is blood glucose, his BP as steroids can worsen all of this, recommended calcium and Vitamin D. Discussed taking the medication as little as possible and with severe flair and higher doses needed call the office.   Interval history; he was on 5mg  daily and symptoms returned, headache, right eye pain, diplopia. Likely again Todd [name]. Discussed long-term effects of steroids and changing to Rituximab.    Interval history 01/15/2017: He is taking 5mg  every other day of prednisone. He had another episode of diplopia and he increased his prednisone to 20mg  for a week and it improved.He feels numbness on his right side of his face that is variable with tingling in his right ear. That started with the Todd [name]. We'll continue low-dose steroids 2.5mg  every other day.  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 He declines any steroid. He was not taking his steroids consistently when he had the flare. Discussed trying 2.5mg  every other day and taking it consistently.    Interval history 07/18/2016: Patient was evaluated at [facility] by neuro-ophthalmology as well as hematology oncology. There is no other cause found for his orbital inflammation, diagnosed with noninfectious orbital inflammation or  [name] syndrome. Patient has been in multiple rounds of steroids. Suggest steroid sparing agents given the side effects of long-term steroid use. Rituximab, cyclophosphamide, methotrexate and CellCept or some of the agents that have been used successfully in [name] syndrome.He is on prednisone 5mg  every other day. Discussed steroid sparing agents. He declines at this time, would prefer to stay on low-dose steroids. Will try 2.5mg  every other day. Steroids can worsen glaucoma so need to follow up with ophthalmologist and let him know about the steroids and discuss wit him. Discussed side effects of long-term steroids.      MRI [facility] 04/2016:  BRAIN Calvarium/skull base: No focal marrow replacing lesion suggestive of neoplasm. Paranasal sinuses: Imaged portions clear. Brain: Previously seen ill-defined enhancing tissue within the bilateral cavernous sinuses has essentially resolved and is favored to represent response to therapy in this patient with a diagnosis of [name] syndrome. Mild asymmetric prominence of the right 3rd cranial nerve may reflect residual inflammation. Similar atrophy of the right optic nerve. No evidence of an acute abnormality. No significant white matter disease or acute ischemia. No mass effect, hemorrhage, or hydrocephalus. No abnormal enhancement to suggest neoplasm, abscess, or mass lesion. Grossly normal flow voids in the major intracranial arteries and dural venous sinuses. Additional comments: Multilevel spondylosis of  the imaged cervical spine.   HPI:  Todd [name] is a 72 y.o. male here as a referral from Todd. Clelia Croft for repeated episodes of painful ophthalmoplegia resolved with high dose steroids suspicious of Tolosa Hunt Syndrome.    Patient is a lovely 72 year old gentleman who is here for another episode of diplopia this time with right eye ptosis as well. 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 He was originally  seen by me in April 2016 for diplopia and left-sided eye pain of unknown etiology. He was having pain on the left side of the head, pain on eye movement, headache, cramping to the middle of the head without light sensitivity, no sound sensitivity, no nausea, no vomiting  He was evaluated inpatient and MRIs(MRI brain, MRI orbits, MRV) and labs(esr,crp) did not reveal etiology. 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  Further re-examination of MRI imaging of the orbits showed enhancing soft tissue mass at the left orbital apex, possibly suggesting  tolosa hunt. The symptoms resolved with a course of high-dose steroids.   Patient returned in October 2016 with similar problems in the opposite eye, the right eye. He described pain in the back of the right eye, right temple cramping pain, double vision, side by side, with both eyes open, pain on eye movement. He had throbbing pain behind the right eye. 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 MRI of the orbits and brain now showed "asymmetry of the cavernous sinuses due to enhancing soft tissue surrounding the right internal carotid artery. 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   This has occurred since the MRI dated 12/29/2014. At that time, there appeared to be similar changes involving the left cavernous sinus." Patient's symptoms again resolved with high-dose steroids. 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 Onset 10 days ago and progressive with vision changes(diplopia).Patient cannot afford anymore imaging. He describes the same symptoms of headaches, pressure behind the eyes, pain on eye movement, blurry and double vision and new right ptosis. No predilection  for time of day.    Interval history 03/14/2016: Patient has been on a tapering course of steroids since last being seen. Started at 60mg  and last dose was a week ago and now he is starting to have the headache again. Will restart at 20mg  and taper more slowly. Today the 3rd nerve palsy is reolved.    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 Formal report: 1. 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 2. No other focal lesion of the orbits to explain diplopia. 3. Mild periventricular and subcortical T2 changes are slightly advanced for age. The finding is nonspecific but can be seen in the setting of chronic microvascular ischemia, a demyelinating process such as multiple sclerosis, vasculitis, complicated migraine headaches, or as the sequelae of a prior infectious or inflammatory process. 4. No focal pathology within the occipital cortex.   MRV 01/13/2015: Normal MRV head (without).   MRI of the brain and orbits 11/08/2015:  1.    Asymmetry of the cavernous sinuses due to enhancing soft tissue surrounding the right internal carotid artery. 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   This has occurred since the MRI dated 12/29/2014. At that time, there appeared to be similar changes involving the left cavernous sinus. 2.    A few scattered T2/FLAIR hyperintense foci consistent with mild chronic microvascular ischemic change.   This appears to have progressed slightly when compared to the prior MRI.   Serum labs unremarkable: TSH, crp, sed rate, cbc, cmp, RF, ANA, RPR, CK, AchR antibodies, NMO, Lyme, pan-anca, IFE, paraneoplastic panel, B12, folate, HIV, Ace   CSF unremarkable: cytology,IGG index, VDRL, oligoclonal bands, ace, , glucose, cell count and  diff, tuberculosis, fungus, gram stain, culture.   CSF mild increased protein 62.  Review of Systems: Patient complains of symptoms per HPI as well as the following symptoms: eye pressure . Pertinent negatives and positives per HPI. All others negative    Social History   Socioeconomic History   Marital status: Married    Spouse name: Not on file   Number of children: 4   Years of education: Bachelor's   Highest education level: Not on file  Occupational History   Occupation: Insurance claims handler [employer]  Tobacco Use   Smoking status: Former    Years: 10    Types: Cigarettes    Quit date: 09/25/1976    Years since quitting: 46.3   Smokeless tobacco: Never  Vaping Use   Vaping Use: Never used  Substance and Sexual Activity   Alcohol use: Yes    Comment: occ   Drug use: No   Sexual activity: Not on file  Other Topics Concern   Not on file  Social History Narrative   Lives at home with wife.   Right handed.   Caffeine use: 1 cup coffee per day.   Occasionally drinks soda and/or tea    Social Determinants of Health   Financial Resource Strain: Not on file  Food Insecurity: Not on file  Transportation Needs: Not on file  Physical Activity: Not on file  Stress: Not on file  Social Connections: Not on file  Intimate Partner Violence: Not on file    Family History  Problem Relation Age of Onset   Glaucoma Mother    Pancreatic cancer Mother    Glaucoma Father    Migraines Neg Hx    Headache Neg Hx     Past Medical History:  Diagnosis Date   Anxiety    Arthritis    Back pain    Fragments in vertebrae    Elevated cholesterol    Glaucoma    right worse than left   High cholesterol    Hypertension    [name] syndrome     Past Surgical History:  Procedure Laterality Date   KNEE ARTHROSCOPY Left 1984    Current Outpatient Medications  Medication Sig Dispense Refill   aspirin 81 MG tablet Take 81 mg by mouth daily.     atenolol (TENORMIN) 25 MG tablet Take 25  mg by mouth daily.     benzonatate (TESSALON) 100 MG capsule Take 1 capsule (100 mg total) by mouth every 8 (eight) hours. 21 capsule 0   calcium carbonate (OS-CAL - DOSED IN MG OF ELEMENTAL CALCIUM) 1250 (500 Ca) MG tablet Take 1 tablet by mouth daily with breakfast.     Cholecalciferol (VITAMIN D3) 50 MCG (2000 UT) CAPS Take 1 capsule by  mouth daily at 6 (six) AM.     co-enzyme Q-10 30 MG capsule Take 30 mg by mouth daily as needed (with joint pain).     dorzolamide-timolol (COSOPT) 22.3-6.8 MG/ML ophthalmic solution Place 1 drop into both eyes 2 (two) times daily.     ezetimibe (ZETIA) 10 MG tablet Take 10 mg by mouth every evening.     HYDROcodone-acetaminophen (NORCO/VICODIN) 5-325 MG per tablet Take 1 tablet by mouth as needed.  0   HYDROcodone-homatropine (HYCODAN) 5-1.5 MG/5ML syrup Take 5 mLs by mouth every 6 (six) hours as needed for cough. 120 mL 0   ibuprofen (ADVIL,MOTRIN) 200 MG tablet Take 400 mg by mouth every 6 (six) hours as needed for headache.     LORazepam (ATIVAN) 0.5 MG tablet Take 1 tablet (0.5 mg total) by mouth every 8 (eight) hours as needed for anxiety. 30 tablet 0   LUMIGAN 0.01 % SOLN Place 1 drop into both eyes daily. At bedtime     Multiple Vitamin (MULTIVITAMIN WITH MINERALS) TABS tablet Take 1 tablet by mouth daily.     Omega-3 Fatty Acids (FISH OIL) 1200 MG CAPS Take 1 capsule by mouth daily.     predniSONE (DELTASONE) 10 MG tablet For the next 14 days please take 10mg  of prednisone and then return to 5mg  daily dosing. 14 tablet 3   predniSONE (DELTASONE) 5 MG tablet Take 1 tablet (5 mg total) by mouth daily with breakfast. 90 tablet 4   UNABLE TO FIND Med Name: Tumreic powder 1/4 teaspoon daily     No current facility-administered medications for this visit.    Allergies as of 02/06/2023 - Review Complete 02/06/2023  Allergen Reaction Noted   Multihance [gadobenate] Nausea And Vomiting 08/02/2017   Alphagan p [brimonidine tartrate] Other (See Comments)  12/29/2014   Lipitor [atorvastatin] Other (See Comments) 01/04/2015    Vitals: BP 132/82 77 standing, 134/80 63 sitting, 134/77 59 laying BP 128/76   Pulse 75   Ht 5\' 11"  (1.803 m)   Wt 194 lb 3.2 oz (88.1 kg)   BMI 27.09 kg/m  Last Weight:  Wt Readings from Last 1 Encounters:  02/06/23 194 lb 3.2 oz (88.1 kg)   Last Height:   Ht Readings from Last 1 Encounters:  02/06/23 5\' 11"  (1.803 m)    Neuro: Detailed Neurologic Exam   Speech:    Speech is normal; fluent and spontaneous with normal comprehension.   Cognition:    The patient is oriented to person, place, and time;      recent and remote memory intact;      language fluent;      normal attention, concentration,      fund of knowledge Cranial Nerves: Stable    (Chronic right eye glaucoma) Right pupil unreactive and 2-31mm, right optic nerve pallor and atrophy, right APD(all chronic due to Glaucoma). Left pupil pinpoint but no ONH edema appreciated. EOMI. Visual fields are limited in the right eye due to severe chronic glaucoma. VF intact left eye. Trigeminal face sensation is intact and the muscles of mastication are normal. The face is otherwise symmetric. The palate elevates in the midline. Hearing intact. Voice is normal. Shoulder shrug is normal. The tongue has normal motion without fasciculations.   Coordination:    Normal    Gait:    normal.     Motor Observation:    No asymmetry, no atrophy, and no involuntary movements noted. Tone:    Normal muscle tone.  Posture:    Posture is normal. normal erect     Strength:    Strength is V/V in the upper and lower limbs.        Sensation: intact to LT       Assessment/Plan:  Todd [name] is a lovely 72y.o. male here as a referral from Todd. Clelia Croft for repeated episodes of painful ophthalmoplegia resolved with high dose steroids. 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 Episodes in the past  resolved with steroids.Review of MRI or the orbits have shown enhancing soft tissue mass at the orbital apex unilateral to symptoms, suggesting  tolosa hunt.  Extensive workup, imaging, labs, csf as above.  Patient was evaluated at [facility] by neuro-ophthalmology as well as hematology oncology. There is no other cause found for his orbital inflammation, diagnosed with noninfectious orbital inflammation or [name] syndrome.   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  Today I reviewed literature with patient.  If left untreated symptoms may resolve spontaneously after about 8 weeks.  Steroids can help with the pain but unclear if it helps with the duration of ophthalmoplegia.  Little consideration has been given to alternative therapies usually because the rapid response of pain within 24 to 72 hours. He has also declined any other agents and prefers steroids prn but now he would like to pursue another opinion and I highly recommend, we have found no other reason except "Tolosa Hunt".   - Today his EOMI, just feeling some "pressure" again , here for refill of steroids. He takes steroids 5mg  continuously and sometimes we increase the dose when having the diplopia (see below) and follow clinically. When this happens (last time a bout a year ago) higher doses of steroids resolve the issue.   - recommended referral to [facility] to see if there is anything else we can do, he agrees (he has been to onc/hemonc,neurooptho, [facility], feel [facility] is the next opinion)  - has right-sided lens replacement about a year ago, repeat. He states he had labs recently with Todd. Clelia Croft including [id number], we will defer labs except check vit D. If symptoms worsen, contact us and recommend repeat MRI brain and orbits w/wo contrast. Also have f/u with  ophthalmology and follow closely with Todd. Clelia Croft for diabetes and counseling on calcium and vitamin D replacement due to the steroids which I recommend and can get OTC, watch for GI upset, dark stools.    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 He declined in the past but now is reconsidering, send to [facility] for opinion,    - Evaluated at [facility] neuro-ophthalmology and oncology/hematology, concurs with diagnosis of Todd [name], had extensive bloodwork, LPs, imaging (see records and labs)   - Disucssed risks of long-term steroids.  Talk to Todd. Clelia Croft about calcium and vitamin D due to long-term steroids Also I will recommend to Todd. Clelia Croft a Dexa scan as she sees clinically appropriate  Continue f/u with Todd. Clelia Croft for diabetes and other long-term side effects of steroids  No orders of the defined types were placed in this encounter.   No orders of the defined types were placed in this encounter.  Cc: Carollee Massed [facility] [address] [phone] fax [phone], Todd. Clelia Croft   I spent 20 minutes of face-to-face and non-face-to-face time with patient on the  No diagnosis found.  diagnosis.  This included previsit chart review, lab review, study review, order entry, electronic health record documentation, patient education on the different diagnostic and therapeutic options, counseling and coordination of care, risks and benefits of management, compliance, or risk factor reduction   No orders of the defined types were placed in this encounter.    Naomie Dean, MD  [facility] [address] [place], [place] [address]  Phone [phone] Fax [phone]  I spent 30 minutes of face-to-face and non-face-to-face time with patient on the  No diagnosis found.  diagnosis.  This included previsit chart review, lab  review, study review, order entry, electronic health record documentation, patient education on the different diagnostic and therapeutic options, counseling and coordination of care, risks and benefits of management, compliance, or risk factor reduction

## 2023-05-14 ENCOUNTER — Encounter: Payer: Self-pay | Admitting: Neurology

## 2023-05-14 ENCOUNTER — Ambulatory Visit: Payer: Medicare Other | Admitting: Neurology

## 2023-05-14 VITALS — BP 134/83 | HR 72 | Ht 71.0 in | Wt 194.0 lb

## 2023-05-14 DIAGNOSIS — M542 Cervicalgia: Secondary | ICD-10-CM | POA: Diagnosis not present

## 2023-05-14 DIAGNOSIS — H4940 Progressive external ophthalmoplegia, unspecified eye: Secondary | ICD-10-CM

## 2023-05-14 DIAGNOSIS — M5481 Occipital neuralgia: Secondary | ICD-10-CM | POA: Diagnosis not present

## 2023-05-14 NOTE — Patient Instructions (Signed)
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 Pain from occipital neuralgia may spread (radiate) to other parts of the head. These headaches may be caused by irritation of the nerves that leave the spinal cord high up in the neck, just below the base of the skull (occipital nerves). The occipital nerves transmit sensations from the back of the head, the top of the head, and the areas behind the ears. What are the causes? This condition can occur without any known cause (primary headache syndrome). In other cases, this condition is caused by pressure on or irritation of one of the two occipital nerves. Pressure and irritation may be due to: Muscle spasm in the neck. Neck injury. Wear and tear of the vertebrae in the neck (osteoarthritis). Disease of the disks that separate the vertebrae. Swollen blood vessels that put pressure on the occipital nerves. Infections. Tumors. Diabetes. What are the signs or symptoms? This condition causes brief burning, stabbing, electric, shocking, or shooting pain in the back of the head that can radiate to the top of the head. It can happen on one side or both sides of the head. It can also cause: Pain behind the eye. Pain triggered by neck movement or hair brushing. Scalp tenderness. Aching in the back of the head between episodes of very bad pain. Pain that gets worse with exposure to bright lights. How is this diagnosed? Your health care provider may diagnose the condition based on a physical exam and your symptoms. Tests may be done, such as: Imaging studies of the brain and neck (cervical spine), such as an MRI or CT scan. These look for causes of pinched nerves. Applying pressure to the nerves in the neck to try to re-create the pain. Injection of  numbing medicine into the occipital nerve areas to see if pain goes away (diagnostic nerve block). How is this treated? Treatment for this condition may begin with simple measures, such as: Rest. Massage. Applying heat or cold to the area. Over-the-counter pain relievers. If these measures do not work, you may need other treatments, including: Medicines, such as: Prescription-strength anti-inflammatory medicines. Muscle relaxants. Anti-seizure medicines, which can relieve pain. Antidepressants, which can relieve pain. Injected medicines, such as medicines that numb the area (local anesthetic) and steroids. Pulsed radiofrequency ablation. This is when wires are implanted to deliver electrical impulses that block pain signals from the occipital nerve. Surgery to relieve nerve pressure. Physical therapy. Follow these instructions at home: Managing pain     Avoid any activities that cause pain. Rest when you have an attack of pain. Try gentle massage to relieve pain. Try a different pillow or sleeping position. If directed, apply heat to the affected area as often as told by your health care provider. Use the heat source that your health care provider recommends, such as a moist heat pack or a heating pad. Place a towel between your skin and the heat source. Leave the heat on for 20-30 minutes. Remove the heat if your skin turns bright red. This is especially important if you are unable to feel pain, heat, or cold. You have a greater risk of getting burned. If directed, put ice on the back of your head and neck area. To do this: Put ice in a plastic bag. Place a towel  between your skin and the bag. Leave the ice on for 20 minutes, 2-3 times a day. Remove the ice if your skin turns bright red. This is very important. If you cannot feel pain, heat, or cold, you have a greater risk of damage to the area. General instructions Take over-the-counter and prescription medicines only as told  by your health care provider. Avoid things that make your symptoms worse, such as bright lights. Try to stay active. Get regular exercise that does not cause pain. Ask your health care provider to suggest safe exercises for you. Work with a physical therapist to learn stretching exercises you can do at home. Practice good posture. Keep all follow-up visits. This is important. Contact a health care provider if: Your medicine is not working. You have new or worsening symptoms. Get help right away if: You have very bad head pain that does not go away. You have a sudden change in vision, balance, or speech. These symptoms may represent a serious problem that is an emergency. Do not wait to see if the symptoms will go away. Get medical help right away. Call your local emergency services (911 in the [place] Do not drive yourself to the hospital. Summary Occipital neuralgia is a type of headache that causes brief episodes of very bad pain in the back of the head. Pain from occipital neuralgia may spread (radiate) to other parts of the head. Treatment for this condition includes rest, massage, and medicines. This information is not intended to replace advice given to you by your health care provider. Make sure you discuss any questions you have with your health care provider. Document Revised: 07/11/2020 Document Reviewed: 07/11/2020 [employer] Patient Education  2024 [employer].

## 2023-05-14 NOTE — Progress Notes (Signed)
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  I reviewed his chart, he recently saw ophthalmology.  MRI of the orbits without contrast was ordered.  He has optic atrophy of the right eye.  He is having recurrent symptoms, progressive, external ophthalmoplegia and optic atrophy from note dated 04/08/2023.  He is status post right cataract surgery, atrophic right optic nerve, MRI of the orbits showed no significant abnormality within the cavernous sinus within the limitations of noncontrast MRI, chronically atrophic right optic nerve, favor sequelae of prior insults no evidence of active inflammation.  He saw Dr. Thad [name] at [facility] who is a neurologist, for progressive external ophthalmoplegia and long-term current use of systemic steroids,taking 5mg  prednisone daily. 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  Dr. Thad [name] continued him on prednisone.    Taking vitamin D and calcium. No problems with the prednisone with his stomach. He takes the prednisone as needed when he starts feeling his tolosa hunt flaring. He sees an ophthalmologist and see ophthalmologist regularly now he has glaucoma in the right eye and takes drops, she is aware of the steroids and monitors him closely. We discussed that Prednisone and other steroids can increase intraocular pressure (IOP) in some people, which can lead to steroid-induced glaucoma. This can happen when steroids are taken as eye drops, tablets, injections, or implants and he has discussed with her, I will add her to this note.  If he starts feeling pain and vision changes that he feels is related to his tolsa hunt, not  often a few days a week or every other week. He has not had problems with eye movements, diplopia, opthalmolplegia since last being see.    MRI orbits 04/08/2023: Todd Lark, MD - 04/08/2023  Formatting of this note might be different from the original. 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  FINDINGS: Globes: Status post right cataract surgery. Lenses: Normal appearing native lenses. Optic nerves: Atrophic right optic nerve. Intraconal fat: Normal. Extraocular muscles: Normal. Intraorbital vasculature: Normal. Lacrimal glands: Normal.  Cavernous sinus: No significant abnormalities.  Brain Parenchyma: No mass, mass effect, acute infarct or hemorrhage. Scattered periventricular predominant T2 hyperintense foci, which may represent microvascular ischemic injury. Additionally, there are prominent Virchow-Robin spaces. Intracranial flow voids: Normal. Extra-axial spaces, basal cisterns, ventricles and sulci: Normal. Cranium: Normal. Paranasal sinuses: Normal. Mastoid air cells: Normal.  IMPRESSION: 1. No significant abnormality within the cavernous sinus within the limitations of noncontrast MRI. 2. Chronically atrophic right optic nerve, favor sequela of prior insult, no evidence of active inflammation.   Patient complains of symptoms per HPI as well as the following symptoms: glaucoma . Pertinent negatives and positives per HPI. All others negative  Patient complains of symptoms per HPI as well as the following symptoms: neck pain . Pertinent negatives and positives per HPI. All others negative   02/07/2023: Left eye pain 3 months ago, intermittent, he has  dry eyes using refresh, no double vision but feeling like pressure in the eye. Stable. Just dry eye. Vision may be impaired but may need an upgrade on the glassess. Not pain but a heaviness, started 3-4 months ago hasn't worsened, no problems with color. We called his ophthalmologist and scheduled an earlier appointment as neuro exam was normal. If no other indication, we will increased his steroids to 20mg  and decrease slowly back to 5mg  a day. 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  We reviewed literature with patient in the past.  If left untreated symptoms may resolve spontaneously after about 8 weeks.  Steroids can help with the pain but unclear if it helps with the duration of ophthalmoplegia.  Little consideration has been given to alternative therapies usually because the rapid response of pain within 24 to 72 hours.  It is rare that individuals may need other immunosuppressive medications and because it is rare and not many patients need them, I have not been able to find any clinical trials for immunosuppressant therapy.  According to and NORD the [employer] of rare diseases, this is a rare disorder and usually this is treated with steroids. Discussed risks of long term steroids.   he is here today for follow up, having pressure and symptoms in the left eye again, the last time that happened was last year and feels it coming on again the pressure that prescedes the double vision. He takes steroids 5mg  continuously and sometimes we increase the dose when having the diplopia. We have extensively tested him for causes, send him to multiple specialists, we just do not know why he continues to have eye symptoms that change from one eye to the  other. He has some discomfort but no weakness, no radicular pains, shoots up the back of his head likely occipital irritation.   Patient complains of symptoms per HPI as well as the following symptoms: neck pain . Pertinent negatives and positives per HPI. All others negative   Interval history 08/16/2021: No double vision currently. Just some pressure behind the eyes. Pressure happens every once in a while, more on the left, he had a lens implant on the right eye but it did not help the vision, no worsening vision on the left eye, no double vision. 1-2 weeks he gets some pressure behind is eyes for one hour, in the temple area, again no double vision, no vision changes, no headache, bilateral but maybe more on the left side. He saw his eye doctor about a year ago, recommended seeing his eye doctor again. He ran out of steroids, he was on 5mg  a day, he thinks being off of the steroids has csaused the pressure. If not improved with the steroids, will repeat the imaging.    MRI orbits 06/17/2021 wo contrast: IMPRESSION: Abnormal MRI scan of the orbits without contrast showing atrophy of the right optic nerve and incidental changes of chronic paranasal sinusitis.  Probably no significant change compared with previous MRI orbits from 08/02/2017 though contrast could not be administered during the current scan  MRI brain wo contrast: 06/17/2021 MRI scan of the brain without contrast showing mild changes of chronic small vessel disease which is age-appropriate and atrophy of the right optic nerve.  As compared with previous MRI  from 08/02/2017 with age-related small vessel disease changes are expectedly slightly progressed.  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  Today I reviewed literature with  patient.  If left untreated symptoms may resolve spontaneously after about 8 weeks.  Steroids can help with the pain but unclear if it helps with the duration of ophthalmoplegia.  Little consideration has been given to alternative therapies usually because the rapid response of pain within 24 to 72 hours.  It is rare that individuals may need other immunosuppressive medications and because it is rare and not many patients need them, I have not been able to find any clinical trials for immunosuppressant therapy.  According to and NORD the [employer] of rare diseases, this is a rare disorder and usually this is treated with steroids. Discussed risks of long term steroids.   He has used them "every once in a while" he says the pain cam eon back in November/december. He can feel it coming again. So he will take 5mg  twice a week. He is following his glaucoma, is blood glucose, his BP as steroids can worsen all of this, recommended calcium and Vitamin D. Discussed taking the medication as little as possible and with severe flair and higher doses needed call the office.   Interval history; he was on 5mg  daily and symptoms returned, headache, right eye pain, diplopia. Likely again Todd [name]. Discussed long-term effects of steroids and changing to Rituximab.    Interval history 01/15/2017: He is taking 5mg  every other day of prednisone. He had another episode of diplopia and he increased his prednisone to 20mg  for a week and it improved.He feels numbness on his right side of his face that is variable with tingling in his right ear. That started with the Todd [name]. We'll continue low-dose steroids 2.5mg  every other day.  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 He declines any steroid. He was not taking his steroids consistently when he had the flare. Discussed trying 2.5mg  every  other day and taking it consistently.    Interval history 07/18/2016: Patient was evaluated at [facility] by neuro-ophthalmology as well as hematology oncology. There is no other cause found for his orbital inflammation, diagnosed with noninfectious orbital inflammation or [name] syndrome. Patient has been in multiple rounds of steroids. Suggest steroid sparing agents given the side effects of long-term steroid use. Rituximab, cyclophosphamide, methotrexate and CellCept or some of the agents that have been used successfully in [name] syndrome.He is on prednisone 5mg  every other day. Discussed steroid sparing agents. He declines at this time, would prefer to stay on low-dose steroids. Will try 2.5mg  every other day. Steroids can worsen glaucoma so need to follow up with ophthalmologist and let him know about the steroids and discuss wit him. Discussed side effects of long-term steroids.      MRI [facility] 04/2016:  BRAIN Calvarium/skull base: No focal marrow replacing lesion suggestive of neoplasm. Paranasal sinuses: Imaged portions clear. Brain: Previously seen ill-defined enhancing tissue within the bilateral cavernous sinuses has essentially resolved and is favored to represent response to therapy in this patient with a diagnosis of [name] syndrome. Mild asymmetric prominence of the right 3rd cranial nerve may reflect residual inflammation. Similar atrophy of the right optic nerve.  No evidence of an acute abnormality. No significant white matter disease or acute ischemia. No mass effect, hemorrhage, or hydrocephalus. No abnormal enhancement to suggest neoplasm, abscess, or mass lesion. Grossly normal flow voids in the major intracranial arteries and dural venous sinuses. Additional comments: Multilevel spondylosis of the imaged cervical spine.   HPI:  Todd [name] is a 72 y.o. male here as a referral from Dr. Clelia Croft for repeated episodes of painful ophthalmoplegia resolved with high  dose steroids suspicious of Tolosa Hunt Syndrome.    Patient is a lovely 72 year old gentleman who is here for another episode of diplopia this time with right eye ptosis as well. 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 He was originally seen by me in April 2016 for diplopia and left-sided eye pain of unknown etiology. He was having pain on the left side of the head, pain on eye movement, headache, cramping to the middle of the head without light sensitivity, no sound sensitivity, no nausea, no vomiting  He was evaluated inpatient and MRIs(MRI brain, MRI orbits, MRV) and labs(esr,crp) did not reveal etiology. 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  Further re-examination of MRI imaging of the orbits showed enhancing soft tissue mass at the left orbital apex, possibly suggesting  tolosa hunt. The symptoms resolved with a course of high-dose steroids.   Patient returned in October 2016 with similar problems in the opposite eye, the right eye. He described pain in the back of the right eye, right temple cramping pain, double vision, side by side, with both eyes open, pain on eye movement. He had throbbing pain behind the right eye. 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 MRI of the orbits and brain now showed "asymmetry of the cavernous sinuses due to enhancing soft tissue surrounding the right internal carotid artery. 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   This has occurred since the MRI dated 12/29/2014. At that time, there appeared to be similar changes involving the left cavernous sinus." Patient's symptoms again resolved with high-dose steroids. 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 Onset 10 days ago and progressive with vision changes(diplopia).Patient cannot afford anymore imaging. He describes the same symptoms of headaches, pressure behind the eyes, pain on eye movement, blurry and double vision and new right ptosis. No predilection for time of day.    Interval history 03/14/2016: Patient has been on a tapering course of steroids since last being seen. Started at 60mg  and last dose was a week ago and now he is starting to have the headache again. Will restart at 20mg  and taper more slowly. Today the 3rd nerve palsy is reolved.    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 Formal report: 1. 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 2. No other focal lesion of the orbits to explain diplopia. 3. Mild periventricular and subcortical T2 changes are slightly advanced for age. The finding is nonspecific but can be seen in the setting of chronic microvascular  ischemia, a demyelinating process such as multiple sclerosis, vasculitis, complicated migraine headaches, or as the sequelae of a prior infectious or inflammatory process. 4. No focal pathology within the occipital cortex.   MRV 01/13/2015: Normal MRV head (without).   MRI of the brain and orbits 11/08/2015:   1.    Asymmetry of the cavernous sinuses due to enhancing soft tissue surrounding the right internal carotid artery. 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   This has occurred since the MRI dated 12/29/2014. At that time, there appeared to be similar changes involving the left cavernous sinus. 2.    A few scattered T2/FLAIR hyperintense foci consistent with mild chronic microvascular ischemic change.   This appears to have progressed slightly when  compared to the prior MRI.   Serum labs unremarkable: TSH, crp, sed rate, cbc, cmp, RF, ANA, RPR, CK, AchR antibodies, NMO, Lyme, pan-anca, IFE, paraneoplastic panel, B12, folate, HIV, Ace   CSF unremarkable: cytology,IGG index, VDRL, oligoclonal bands, ace, , glucose, cell count and diff, tuberculosis, fungus, gram stain, culture.   CSF mild increased protein 62.  Review of Systems: Patient complains of symptoms per HPI as well as the following symptoms: eye pressure . Pertinent negatives and positives per HPI. All others negative    Social History   Socioeconomic History   Marital status: Married    Spouse name: Not on file   Number of children: 4   Years of education: Bachelor's   Highest education level: Not on file  Occupational History   Occupation: Insurance claims handler [employer]  Tobacco Use   Smoking status: Former    Current packs/day: 0.00    Types: Cigarettes    Start date: 09/25/1966    Quit date: 09/25/1976    Years since quitting: 46.6   Smokeless tobacco: Never  Vaping Use   Vaping status: Never Used  Substance and Sexual Activity   Alcohol use: Yes    Comment: occ   Drug use: No   Sexual activity: Not on file  Other Topics Concern   Not on file  Social History Narrative   Lives at home with wife.   Right handed.   Caffeine use: 1 cup coffee per day.   Occasionally drinks soda and/or tea    Social Determinants of Health   Financial Resource Strain: Not on file  Food Insecurity: Not on file  Transportation Needs: Not on file  Physical Activity: Not on file  Stress: Not on file  Social Connections: Not on file  Intimate Partner Violence: Not on file    Family History  Problem Relation Age of Onset   Glaucoma Mother    Pancreatic cancer Mother    Glaucoma Father    Migraines Neg Hx    Headache Neg Hx     Past Medical History:  Diagnosis Date   Anxiety    Arthritis    Back pain    Fragments in vertebrae    Elevated cholesterol    Glaucoma    right  worse than left   High cholesterol    Hypertension    [name] syndrome     Past Surgical History:  Procedure Laterality Date   KNEE ARTHROSCOPY Left 1984    Current Outpatient Medications  Medication Sig Dispense Refill   aspirin 81 MG tablet Take 81 mg by mouth daily.     atenolol (TENORMIN) 25 MG tablet Take 25 mg by mouth daily.     benzonatate (TESSALON) 100 MG  capsule Take 1 capsule (100 mg total) by mouth every 8 (eight) hours. 21 capsule 0   calcium carbonate (OS-CAL - DOSED IN MG OF ELEMENTAL CALCIUM) 1250 (500 Ca) MG tablet Take 1 tablet by mouth daily with breakfast.     Cholecalciferol (VITAMIN D3) 50 MCG (2000 UT) CAPS Take 1 capsule by mouth daily at 6 (six) AM.     co-enzyme Q-10 30 MG capsule Take 30 mg by mouth daily as needed (with joint pain).     dorzolamide-timolol (COSOPT) 22.3-6.8 MG/ML ophthalmic solution Place 1 drop into both eyes 2 (two) times daily.     ezetimibe (ZETIA) 10 MG tablet Take 10 mg by mouth every evening.     HYDROcodone-acetaminophen (NORCO/VICODIN) 5-325 MG per tablet Take 1 tablet by mouth as needed.  0   HYDROcodone-homatropine (HYCODAN) 5-1.5 MG/5ML syrup Take 5 mLs by mouth every 6 (six) hours as needed for cough. 120 mL 0   ibuprofen (ADVIL,MOTRIN) 200 MG tablet Take 400 mg by mouth every 6 (six) hours as needed for headache.     LORazepam (ATIVAN) 0.5 MG tablet Take 1 tablet (0.5 mg total) by mouth every 8 (eight) hours as needed for anxiety. 30 tablet 0   LUMIGAN 0.01 % SOLN Place 1 drop into both eyes daily. At bedtime     Multiple Vitamin (MULTIVITAMIN WITH MINERALS) TABS tablet Take 1 tablet by mouth daily.     Omega-3 Fatty Acids (FISH OIL) 1200 MG CAPS Take 1 capsule by mouth daily.     predniSONE (DELTASONE) 5 MG tablet Take 1 tablet (5 mg total) by mouth daily with breakfast. 90 tablet 4   UNABLE TO FIND Med Name: Tumreic powder 1/4 teaspoon daily     No current facility-administered medications for this visit.    Allergies  as of 05/14/2023 - Review Complete 05/14/2023  Allergen Reaction Noted   Multihance [gadobenate] Nausea And Vomiting 08/02/2017   Alphagan p [brimonidine tartrate] Other (See Comments) 12/29/2014   Lipitor [atorvastatin] Other (See Comments) 01/04/2015    Vitals: BP 132/82 77 standing, 134/80 63 sitting, 134/77 59 laying BP 134/83   Pulse 72   Ht 5\' 11"  (1.803 m)   Wt 194 lb (88 kg)   BMI 27.06 kg/m  Last Weight:  Wt Readings from Last 1 Encounters:  05/14/23 194 lb (88 kg)   Last Height:   Ht Readings from Last 1 Encounters:  05/14/23 5\' 11"  (1.803 m)    Physical exam: Exam: Gen: NAD, conversant, well nourised, obese, well groomed                     CV: RRR, no MRG. No Carotid Bruits. + mild distal peripheral edema in feet and ankles, warm, nontender Eyes: Conjunctivae clear without exudates or hemorrhage  Neuro: Detailed Neurologic Exam  Speech:    Speech is normal; fluent and spontaneous with normal comprehension.  Cognition:    The patient is oriented to person, place, and time;     recent and remote memory intact;     language fluent;     normal attention, concentration,     fund of knowledge Cranial Nerves:    Patient has chronic right eye glaucoma and optic nerve atrophy and vision loss in the right eye.  This is stable.  Right pupil is unreactive and 2 to 3 mm which is stable.  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  Left pupil is pinpoint and reactive, but no optic head nerve  edema appreciated and his extraocular movements are intact.  Visual fields are intact in the left eye.  Trigeminal sensation is intact.  The face is symmetrical.  Palate elevates midline, hearing intact, voice normal, shoulder shrug normal, tongue has normal motion without fasciculations.   Coordination: nml  Gait: nml  Motor Observation:    No asymmetry, no atrophy, and no involuntary movements noted. Tone:    Normal muscle tone.    Posture:    Posture is normal. normal erect     Strength:    Strength is V/V in the upper and lower limbs.      Sensation: intact to LT     Reflex Exam:  DTR's:    Deep tendon reflexes in the upper and lower extremities are symmertical bilaterally.   Toes:    The toes are equiv bilaterally.   Clonus:    Clonus is absent.        Assessment/Plan:  ZEALAND KNIGGE is a lovely 71y.o. male here as a referral from Dr. Clelia Croft for repeated episodes of painful ophthalmoplegia resolved with high dose steroids. Now on steroids prn. 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 Episodes in the past resolved with steroids.Review of MRI or the orbits have shown enhancing soft tissue mass at the orbital apex unilateral to symptoms, suggesting  tolosa hunt but last MRI orbits was normal (sent to [facility] for another opinion in eurology).  Extensive workup, imaging, labs, csf as above.  Patient was evaluated at [facility] by neuro-ophthalmology as well as hematology oncology and saw a [facility] neurologist as second opinion. There is no other cause found for his orbital inflammation, diagnosed with noninfectious orbital inflammation or [name] syndrome.   He has some new neck pain, no radiation, no radic, some cervicalgia, will sent to PT. No red flags, strength intact, ROM intact.   Orders Placed This Encounter  Procedures   Ambulatory referral to Physical Therapy    - Disucssed risks of long-term steroids.  Talk to Dr. Clelia Croft about calcium and vitamin D due to long-term steroids Also I will recommend to Dr. Clelia Croft a Dexa scan as she sees clinically appropriate  Continue f/u with Dr. Clelia Croft for diabetes and other long-term side effects of steroids Continue f/u with ophthalmology for glaucome now in the left eye as well. I am concerned prednisome can make this worse will cc his new ophthalmologist Dr. Sissy Hoff.  -  This is a lovely patient here for follow-up of [name] syndrome.  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  We reviewed literature with patient in the past.  If left untreated symptoms may resolve spontaneously after about 8 weeks.  Steroids can help with the pain but unclear if it helps with the duration of ophthalmoplegia.  Little consideration has been given to alternative therapies usually because the rapid response of pain within 24 to 72 hours.  It is rare that individuals may need other immunosuppressive medications and because it is rare and not many patients need them, I have not been able to find any clinical trials for immunosuppressant therapy.  According to and NORD the [employer] of rare diseases, this is a rare disorder and usually this is treated with steroids. Discussed risks of long term steroids.   - Symptoms recently include Left or right eye "heaviness" or opthalmoplegia, which  not at this time and that is when he uses steroids, intermittent, he has dry eyes using refresh, no double vision but feeling like pressure in the eye. Stable. Has dry eye. Vision may be impaired but may need an upgrade on the glassess. Not pain but a heaviness, sresolved currently, no problems with color. We called his ophthalmologist last appointment and scheduled an earlier appointment as neuro exam was normal. If no other indication and he develops worsening symptoms, we will increased his steroids to 20mg  and decrease slowly back to 5mg  a day. For now 5mg  prn.  - Dexa scan for long-term use of steroids, per Dr. Clelia Croft   - MRI orbits last at [facility] 03/2023 was normal,   - Today his EOMI, . He takes steroids 5mg  prn and sometimes we increase the dose when having the diplopia (see below) and follow clinically. When this happens (last time a bout a year ago) higher doses of steroids resolve the issue.   - has been referred and seen at academic centers and multiple  specialties and recently [facility] neuro, no cause found  - has right-sided lens replacement about a year ago,. He states he had labs recently with Dr. Clelia Croft including [id number], we will defer, takes calcium and vitamin D.  If symptoms worsen, contact us and recommend repeat MRI brain and orbits w/wo contrast. Also have f/u with ophthalmology and follow closely with Dr. Clelia Croft for diabetes and counseling on calcium and vitamin D replacement due to the steroids which I recommend and can get OTC, watch for GI upset, dark stools.    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 He declined in the past but now is reconsidering, send to [facility] for opinion who also stated we will continue the steroid and performed an MRI orbits which was negative. Follow with ophthalmology and primary care. Cont to follow with them for presnisone use (can worsen glaucoma) and for management of calcium/vitD supplementation. We will monitor and prescribe steroids and monitor the [facility].    - Evaluated at [facility] neuro-ophthalmology and oncology/hematology, concurs with diagnosis of Todd [name], had extensive bloodwork, LPs, imaging (see records and labs) and recently at [facility] neuro all for other opinion   Cc: Fredderick Phenix [facility] [address] [phone] fax [phone], Dr. Clelia Croft   I spent over 40 minutes of face-to-face and non-face-to-face time with patient on the  1. [name] syndrome   2. Cervicalgia   3. Bilateral occipital neuralgia      diagnosis.  This included previsit chart review, lab review, study review, order entry, electronic health record documentation, patient education on the different diagnostic and therapeutic options, counseling and coordination of care, risks and benefits of management, compliance, or risk factor reduction   Naomie Dean, MD  [facility] [address] [place], [place] [address]  Phone [phone] Fax [phone]

## 2023-05-16 ENCOUNTER — Ambulatory Visit: Payer: Medicare Other | Attending: Neurology | Admitting: Physical Therapy

## 2023-05-16 DIAGNOSIS — M542 Cervicalgia: Secondary | ICD-10-CM | POA: Insufficient documentation

## 2023-05-16 DIAGNOSIS — M5481 Occipital neuralgia: Secondary | ICD-10-CM | POA: Diagnosis present

## 2023-05-16 NOTE — Therapy (Signed)
OUTPATIENT PHYSICAL THERAPY CERVICAL EVALUATION   Patient Name: Todd [name] MRN: [id number] DOB:04-20-1951, 72 y.o., male Today's Date: 05/16/2023  END OF SESSION:  PT End of Session - 05/16/23 1105     Visit Number 1    Number of Visits 5   Plus eval   Date for PT Re-Evaluation 06/27/23   Due to delay in scheduling   Authorization Type BCBS Medicare    PT Start Time 1103    PT Stop Time 1134    PT Time Calculation (min) 31 min    Activity Tolerance Patient tolerated treatment well    Behavior During Therapy [facility] for tasks assessed/performed             Past Medical History:  Diagnosis Date   Anxiety    Arthritis    Back pain    Fragments in vertebrae    Elevated cholesterol    Glaucoma    right worse than left   High cholesterol    Hypertension    [name] syndrome    Past Surgical History:  Procedure Laterality Date   KNEE ARTHROSCOPY Left 1984   Patient Active Problem List   Diagnosis Date Noted   Long term (current) use of systemic steroids 07/12/2022   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   Diplopia 01/12/2015   Headache 01/12/2015   Painful ophthalmoplegia of left eye 01/11/2015   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    PCP: [name], MD  REFERRING PROVIDER: Anson Fret, MD  REFERRING DIAG: M54.2 (ICD-10-CM) - Cervicalgia M54.81 (ICD-10-CM) - Bilateral occipital neuralgia  THERAPY DIAG:  Cervicalgia  Bilateral occipital neuralgia  Rationale for Evaluation and Treatment: Rehabilitation  ONSET DATE: 05/14/2023 (referral)  SUBJECTIVE:                                                                                                                                                                                                         SUBJECTIVE STATEMENT: "Todd [name]"   Pt reports he started having neck pain and spasms about a month ago. The pain starts in the back of his  neck and radiates up towards his temporal region. Feels more tender on the right side compared to the left. Mostly feels pain when he turns his head. Sleeps with a neck pillow. Talks a walk in the evenings but is mostly sedentary during the day     PERTINENT HISTORY:  [name] Syndrome   PAIN:  Are you having pain? Yes: NPRS scale: 4-6/10 Pain location: neck Pain  description: Throbbing Aggravating factors: N/A Relieving factors: Heat  PRECAUTIONS: Fall    WEIGHT BEARING RESTRICTIONS: No  FALLS:  Has patient fallen in last 6 months? No  LIVING ENVIRONMENT: Lives with: lives with their spouse and lives with their daughter Lives in: House/apartment Stairs: Yes: Internal: 4 steps; on right going up Has following equipment at home: None  PLOF: Independent  PATIENT GOALS: "I am not too sure"   NEXT MD VISIT: 04/2024  OBJECTIVE:   DIAGNOSTIC FINDINGS:  MRI of brain from 05/2021  IMPRESSION: MRI scan of the brain without contrast showing mild changes of chronic small vessel disease which is age-appropriate and atrophy of the right optic nerve.  As compared with previous MRI from 08/02/2017 with age-related small vessel disease changes are expectedly slightly progressed.   No imaging of C-spine on file   PATIENT SURVEYS:  NDI 8/50 (mild disability)  COGNITION: Overall cognitive status: Within functional limits for tasks assessed  SENSATION: Pt denies numbness/tingling of his BUEs   POSTURE: rounded shoulders, forward head, and Head rotated to L side   PALPATION: No TTP noted    CERVICAL ROM: Tested in seated position. * = pain w/movement  Active ROM A/PROM (deg) eval  Flexion 55  Extension 32*  Right lateral flexion 22*  Left lateral flexion 30*  Right rotation 38*  Left rotation 45*   (Blank rows = not tested)  UPPER EXTREMITY ROM: All WNL   Active ROM Right eval Left eval  Shoulder flexion    Shoulder extension    Shoulder abduction    Shoulder  adduction    Shoulder extension    Shoulder internal rotation    Shoulder external rotation    Elbow flexion    Elbow extension    Wrist flexion    Wrist extension    Wrist ulnar deviation    Wrist radial deviation    Wrist pronation    Wrist supination     (Blank rows = not tested)  TODAY'S TREATMENT:          Next Session                                                                                                                        PATIENT EDUCATION:  Education details: POC, eval findings, info on TPDN  Person educated: Patient Education method: Explanation Education comprehension: verbalized understanding  HOME EXERCISE PROGRAM: To be established   ASSESSMENT:  CLINICAL IMPRESSION: Patient is a 72 year old male referred to Neuro OPPT for cervicalgia. Pt's PMH is significant for: Glaucoma, anxiety, HTN, [name] Syndrome. The following deficits were present during the exam: decreased functional mobility of cervical spine and pain. Based on NDI, pt has mild disability due to cervicalgia. Pt would benefit from skilled PT to address these impairments and functional limitations to maximize functional mobility independence.    OBJECTIVE IMPAIRMENTS: decreased activity tolerance, decreased ROM, increased muscle spasms, impaired vision/preception, and pain  ACTIVITY LIMITATIONS: sleeping and reach over  head  PARTICIPATION LIMITATIONS: driving, community activity, and yard work  PERSONAL FACTORS: Fitness and Past/current experiences are also affecting patient's functional outcome.   REHAB POTENTIAL: Good  CLINICAL DECISION MAKING: Stable/uncomplicated  EVALUATION COMPLEXITY: Low   GOALS: Goals reviewed with patient? Yes  STG= LTG DUE TO POC LENGTH    LONG TERM GOALS: Target date: 06/13/2023    Pt will be independent with final HEP for improved strength, ROM and decreased pain   Baseline: not established on eval  Goal status: INITIAL  2.  Pt will  improve NDI to </= 3/50 for reduced pain levels and improved QOL  Baseline: 8/50 Goal status: INITIAL  3.  Pt will improve cervical lateral flexion on left and right side by 10 degrees or greater for improved functional mobility and return to PLOF Baseline: 20 degrees on L, 22 degrees on R Goal status: INITIAL   PLAN:  PT FREQUENCY: 1x/week  PT DURATION: 4 weeks (POC written for 6 weeks due to delay in scheduling)   PLANNED INTERVENTIONS: Therapeutic exercises, Therapeutic activity, Neuromuscular re-education, Balance training, Gait training, Patient/Family education, Self Care, Joint mobilization, Joint manipulation, Vestibular training, Canalith repositioning, Aquatic Therapy, Dry Needling, Moist heat, Manual therapy, and Re-evaluation  PLAN FOR NEXT SESSION: Establish HEP for improved cervical ROM and periscapular strength. TPDN? Sub-occipital release    Jill Alexanders Rielynn Trulson, PT, DPT 05/16/2023, 11:37 AM

## 2023-05-23 ENCOUNTER — Ambulatory Visit: Payer: Medicare Other | Admitting: Physical Therapy

## 2023-05-23 ENCOUNTER — Encounter: Payer: Self-pay | Admitting: Physical Therapy

## 2023-05-23 DIAGNOSIS — M542 Cervicalgia: Secondary | ICD-10-CM

## 2023-05-23 NOTE — Therapy (Signed)
OUTPATIENT PHYSICAL THERAPY CERVICAL TREATMENT   Patient Name: Todd [name] MRN: [id number] DOB:Jun 10, 1951, 72 y.o., male Today's Date: 05/23/2023  END OF SESSION:  PT End of Session - 05/23/23 1153     Visit Number 2    Number of Visits 5   Plus eval   Date for PT Re-Evaluation 06/27/23   Due to delay in scheduling   Authorization Type BCBS Medicare    PT Start Time 1149    PT Stop Time 1230    PT Time Calculation (min) 41 min    Activity Tolerance Patient tolerated treatment well    Behavior During Therapy [facility] for tasks assessed/performed             Past Medical History:  Diagnosis Date   Anxiety    Arthritis    Back pain    Fragments in vertebrae    Elevated cholesterol    Glaucoma    right worse than left   High cholesterol    Hypertension    [name] syndrome    Past Surgical History:  Procedure Laterality Date   KNEE ARTHROSCOPY Left 1984   Patient Active Problem List   Diagnosis Date Noted   Long term (current) use of systemic steroids 07/12/2022   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   Diplopia 01/12/2015   Headache 01/12/2015   Painful ophthalmoplegia of left eye 01/11/2015   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    PCP: [name], MD  REFERRING PROVIDER: Anson Fret, MD  REFERRING DIAG: M54.2 (ICD-10-CM) - Cervicalgia M54.81 (ICD-10-CM) - Bilateral occipital neuralgia  THERAPY DIAG:  Cervicalgia  Rationale for Evaluation and Treatment: Rehabilitation  ONSET DATE: 05/14/2023 (referral)  SUBJECTIVE:                                                                                                                                                                                                         SUBJECTIVE STATEMENT: "Todd [name]"   Pt reports he has been using a heating pad and this has helped his pain some.  He denies acute changes.  PERTINENT HISTORY:   [name] Syndrome   PAIN:  Are you having pain? Yes: NPRS scale: 5/10 Pain location: bilateral neck (right side worse) Pain description: radiates to top of head Aggravating factors: rotating head Relieving factors: Heat  PRECAUTIONS: Fall    WEIGHT BEARING RESTRICTIONS: No  FALLS:  Has patient fallen in last 6 months? No  LIVING ENVIRONMENT: Lives with: lives with their spouse and lives with their daughter  Lives in: House/apartment Stairs: Yes: Internal: 4 steps; on right going up Has following equipment at home: None  PLOF: Independent  PATIENT GOALS: "I am not too sure"   NEXT MD VISIT: 04/2024  OBJECTIVE:   DIAGNOSTIC FINDINGS:  MRI of brain from 05/2021  IMPRESSION: MRI scan of the brain without contrast showing mild changes of chronic small vessel disease which is age-appropriate and atrophy of the right optic nerve.  As compared with previous MRI from 08/02/2017 with age-related small vessel disease changes are expectedly slightly progressed.   No imaging of C-spine on file   PATIENT SURVEYS:  NDI 8/50 (mild disability)  COGNITION: Overall cognitive status: Within functional limits for tasks assessed  SENSATION: Pt denies numbness/tingling of his BUEs   POSTURE: rounded shoulders, forward head, and Head rotated to L side   PALPATION: No TTP noted    CERVICAL ROM: Tested in seated position. * = pain w/movement  Active ROM A/PROM (deg) eval  Flexion 55  Extension 32*  Right lateral flexion 22*  Left lateral flexion 30*  Right rotation 38*  Left rotation 45*   (Blank rows = not tested)  UPPER EXTREMITY ROM: All WNL   Active ROM Right eval Left eval  Shoulder flexion    Shoulder extension    Shoulder abduction    Shoulder adduction    Shoulder extension    Shoulder internal rotation    Shoulder external rotation    Elbow flexion    Elbow extension    Wrist flexion    Wrist extension    Wrist ulnar deviation    Wrist radial deviation     Wrist pronation    Wrist supination     (Blank rows = not tested)  TODAY'S TREATMENT:          05/23/2023  Went through upper trap, cervical flexion, and cervical extension stretches without pt reporting notable stretch or discomfort so did not add to HEP as do not feel effective for pt today  -SCM stretch 3x45 seconds each side, pt feels deep intense stretch per report; cued for paced breathing to promote muscle lengthening -Scapular retraction w/ red theraband 2x12 w/ 2 second hold -Supine cervical retraction 2x10 w/ 1 second hold -Supine chin tuck 2x10 w/ 1 second hold          -Manual therapy:  suboccipital release, STM to cervical paraspinals and insertion of SCM with most tenderness on right side of neck, some volume difference noted with right enlargement (edema?)                                                                                         PATIENT EDUCATION:  Education details:  Discussed diaphragmatic breathing vs mouth breathing to prevent over activation of superficial muscles like SCM during resting breathing.  Initial HEP. Person educated: Patient Education method: Explanation Education comprehension: verbalized understanding  HOME EXERCISE PROGRAM: Access Code: [email or web address] URL: [email or web address] Date: 05/23/2023 Prepared by: Camille Bal  Exercises - Sternocleidomastoid Stretch  - 1 x daily - 5 x weekly - 1 sets - 2-3 reps - 30-45 seconds hold - Scapular retraction with resistance  -  1 x daily - 4-5 x weekly - 2 sets - 12 reps - 2 seconds hold - Supine Cervical Retraction with Towel  - 1 x daily - 5 x weekly - 2-3 sets - 10 reps - 1-2 seconds hold - Supine Chin Tuck  - 1 x daily - 5-6 x weekly - 2-3 sets - 10 reps - 1-2 seconds hold  ASSESSMENT:  CLINICAL IMPRESSION: Initiated HEP for cervical mobility and pain management established today.  Trialed manual therapy to the posterior cervical chain.  Patient has tenderness and some  increased volume in the insertion area of the right SCM.  He has most response to stretching of SCM during exercise.  Will continue to address pain management likely with TPDN if pt remains agreeable and certified therapist feels appropriate at next visit.  OBJECTIVE IMPAIRMENTS: decreased activity tolerance, decreased ROM, increased muscle spasms, impaired vision/preception, and pain  ACTIVITY LIMITATIONS: sleeping and reach over head  PARTICIPATION LIMITATIONS: driving, community activity, and yard work  PERSONAL FACTORS: Fitness and Past/current experiences are also affecting patient's functional outcome.   REHAB POTENTIAL: Good  CLINICAL DECISION MAKING: Stable/uncomplicated  EVALUATION COMPLEXITY: Low   GOALS: Goals reviewed with patient? Yes  STG= LTG DUE TO POC LENGTH    LONG TERM GOALS: Target date: 06/13/2023    Pt will be independent with final HEP for improved strength, ROM and decreased pain   Baseline: not established on eval  Goal status: INITIAL  2.  Pt will improve NDI to </= 3/50 for reduced pain levels and improved QOL  Baseline: 8/50 Goal status: INITIAL  3.  Pt will improve cervical lateral flexion on left and right side by 10 degrees or greater for improved functional mobility and return to PLOF Baseline: 20 degrees on L, 22 degrees on R Goal status: INITIAL   PLAN:  PT FREQUENCY: 1x/week  PT DURATION: 4 weeks (POC written for 6 weeks due to delay in scheduling)   PLANNED INTERVENTIONS: Therapeutic exercises, Therapeutic activity, Neuromuscular re-education, Balance training, Gait training, Patient/Family education, Self Care, Joint mobilization, Joint manipulation, Vestibular training, Canalith repositioning, Aquatic Therapy, Dry Needling, Moist heat, Manual therapy, and Re-evaluation  PLAN FOR NEXT SESSION: Add to HEP as needed for improved cervical ROM and periscapular strength. TPDN - SCM bilaterally? Sub-occipital release, serratus punch, try  chirp wheel w/ bilateral arm raise for mobilization   Sadie Haber, PT, DPT 05/23/2023, 1:06 PM

## 2023-05-30 ENCOUNTER — Ambulatory Visit: Payer: Medicare Other | Attending: Neurology | Admitting: Physical Therapy

## 2023-05-30 DIAGNOSIS — M542 Cervicalgia: Secondary | ICD-10-CM | POA: Diagnosis present

## 2023-05-30 DIAGNOSIS — M5481 Occipital neuralgia: Secondary | ICD-10-CM | POA: Diagnosis present

## 2023-05-30 NOTE — Therapy (Signed)
OUTPATIENT PHYSICAL THERAPY CERVICAL TREATMENT   Patient Name: Todd [name] MRN: [id number] DOB:10-04-1950, 72 y.o., male Today's Date: 05/30/2023  END OF SESSION:  PT End of Session - 05/30/23 1318     Visit Number 3    Number of Visits 5   Plus eval   Date for PT Re-Evaluation 06/27/23   Due to delay in scheduling   Authorization Type BCBS Medicare    PT Start Time 1315    PT Stop Time 1356    PT Time Calculation (min) 41 min    Activity Tolerance Patient tolerated treatment well    Behavior During Therapy [facility] for tasks assessed/performed              Past Medical History:  Diagnosis Date   Anxiety    Arthritis    Back pain    Fragments in vertebrae    Elevated cholesterol    Glaucoma    right worse than left   High cholesterol    Hypertension    [name] syndrome    Past Surgical History:  Procedure Laterality Date   KNEE ARTHROSCOPY Left 1984   Patient Active Problem List   Diagnosis Date Noted   Long term (current) use of systemic steroids 07/12/2022   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   Diplopia 01/12/2015   Headache 01/12/2015   Painful ophthalmoplegia of left eye 01/11/2015   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    PCP: [name], MD  REFERRING PROVIDER: Anson Fret, MD  REFERRING DIAG: M54.2 (ICD-10-CM) - Cervicalgia M54.81 (ICD-10-CM) - Bilateral occipital neuralgia  THERAPY DIAG:  Cervicalgia  Bilateral occipital neuralgia  Rationale for Evaluation and Treatment: Rehabilitation  ONSET DATE: 05/14/2023 (referral)  SUBJECTIVE:                                                                                                                                                                                                         SUBJECTIVE STATEMENT: "Todd [name]"   Pt reports that his pain is getting better, pain is 4/10 today. Pt reports that his HEP is going well,  does increase his pain at first but then helps. Pt reports he has more pain with turning his head vs looking up/down.   PERTINENT HISTORY:  [name] Syndrome   PAIN:  Are you having pain? Yes: NPRS scale: 4/10 Pain location: bilateral neck (right side worse) Pain description: radiates to top of head Aggravating factors: rotating head Relieving factors: Heat  PRECAUTIONS: Fall    WEIGHT BEARING  RESTRICTIONS: No  FALLS:  Has patient fallen in last 6 months? No  LIVING ENVIRONMENT: Lives with: lives with their spouse and lives with their daughter Lives in: House/apartment Stairs: Yes: Internal: 4 steps; on right going up Has following equipment at home: None  PLOF: Independent  PATIENT GOALS: "I am not too sure"   NEXT MD VISIT: 04/2024  OBJECTIVE:   DIAGNOSTIC FINDINGS:  MRI of brain from 05/2021  IMPRESSION: MRI scan of the brain without contrast showing mild changes of chronic small vessel disease which is age-appropriate and atrophy of the right optic nerve.  As compared with previous MRI from 08/02/2017 with age-related small vessel disease changes are expectedly slightly progressed.   No imaging of C-spine on file   PATIENT SURVEYS:  NDI 8/50 (mild disability)  COGNITION: Overall cognitive status: Within functional limits for tasks assessed  SENSATION: Pt denies numbness/tingling of his BUEs   POSTURE: rounded shoulders, forward head, and Head rotated to L side   PALPATION: No TTP noted    CERVICAL ROM: Tested in seated position. * = pain w/movement  Active ROM A/PROM (deg) eval  Flexion 55  Extension 32*  Right lateral flexion 22*  Left lateral flexion 30*  Right rotation 38*  Left rotation 45*   (Blank rows = not tested)  UPPER EXTREMITY ROM: All WNL   Active ROM Right eval Left eval  Shoulder flexion    Shoulder extension    Shoulder abduction    Shoulder adduction    Shoulder extension    Shoulder internal rotation    Shoulder  external rotation    Elbow flexion    Elbow extension    Wrist flexion    Wrist extension    Wrist ulnar deviation    Wrist radial deviation    Wrist pronation    Wrist supination     (Blank rows = not tested)  TODAY'S TREATMENT:           TherAct Discussed trigger point dry needling and provided handout as well as watched YouTube video with patient, however he would like to think about it more before trying it. Wrote out muscles that patient could benefit from having needled so that he can do more research (suboccipitals, splenius capitus, SCM).  Demonstrated use of chirp wheel for suboccipital release. Pt performed the following exercises while supine on chirp wheel: Supine chirp wheel with shoulder flexion x 10 reps Supine chirp wheel with shoulder abduction x 10 reps           Manual Therapy Cervical rotation L/R 2 x 30 sec each with STM to cervical paraspinals and insertion of SCM with ongoing tenderness R side > L side.                                                                 PATIENT EDUCATION:  Education details: TPDN Person educated: Patient Education method: Explanation, Handouts, and YouTube Video Education comprehension: verbalized understanding  HOME EXERCISE PROGRAM: Access Code: [email or web address] URL: [email or web address] Date: 05/23/2023 Prepared by: Camille Bal  Exercises - Sternocleidomastoid Stretch  - 1 x daily - 5 x weekly - 1 sets - 2-3 reps - 30-45 seconds hold - Scapular retraction with resistance  - 1 x daily - 4-5 x weekly -  2 sets - 12 reps - 2 seconds hold - Supine Cervical Retraction with Towel  - 1 x daily - 5 x weekly - 2-3 sets - 10 reps - 1-2 seconds hold - Supine Chin Tuck  - 1 x daily - 5-6 x weekly - 2-3 sets - 10 reps - 1-2 seconds hold  ASSESSMENT:  CLINICAL IMPRESSION: Emphasis of skilled PT session on educating patient on dry needling and performing manual therapy to SCM and cervical paraspinals. Pt hesitant to  try DN this date following education and would like to think about it more before initiating treatment. Pt does continue to exhibit tight cervical musculature and benefits from ongoing skilled therapy services to work towards increased independence with management of pain symptoms. Continue POC.   OBJECTIVE IMPAIRMENTS: decreased activity tolerance, decreased ROM, increased muscle spasms, impaired vision/preception, and pain  ACTIVITY LIMITATIONS: sleeping and reach over head  PARTICIPATION LIMITATIONS: driving, community activity, and yard work  PERSONAL FACTORS: Fitness and Past/current experiences are also affecting patient's functional outcome.   REHAB POTENTIAL: Good  CLINICAL DECISION MAKING: Stable/uncomplicated  EVALUATION COMPLEXITY: Low   GOALS: Goals reviewed with patient? Yes  STG= LTG DUE TO POC LENGTH    LONG TERM GOALS: Target date: 06/13/2023    Pt will be independent with final HEP for improved strength, ROM and decreased pain   Baseline: not established on eval  Goal status: INITIAL  2.  Pt will improve NDI to </= 3/50 for reduced pain levels and improved QOL  Baseline: 8/50 Goal status: INITIAL  3.  Pt will improve cervical lateral flexion on left and right side by 10 degrees or greater for improved functional mobility and return to PLOF Baseline: 20 degrees on L, 22 degrees on R Goal status: INITIAL   PLAN:  PT FREQUENCY: 1x/week  PT DURATION: 4 weeks (POC written for 6 weeks due to delay in scheduling)   PLANNED INTERVENTIONS: Therapeutic exercises, Therapeutic activity, Neuromuscular re-education, Balance training, Gait training, Patient/Family education, Self Care, Joint mobilization, Joint manipulation, Vestibular training, Canalith repositioning, Aquatic Therapy, Dry Needling, Moist heat, Manual therapy, and Re-evaluation  PLAN FOR NEXT SESSION: Add to HEP as needed for improved cervical ROM and periscapular strength. TPDN - suboccipitals,  splenius, SCM bilaterally if patient agreeable? Sub-occipital release, serratus punch   Peter Congo, PT, DPT, CSRS  05/30/2023, 1:57 PM

## 2023-06-06 ENCOUNTER — Ambulatory Visit: Payer: Medicare Other | Admitting: Physical Therapy

## 2023-06-06 DIAGNOSIS — M542 Cervicalgia: Secondary | ICD-10-CM | POA: Diagnosis not present

## 2023-06-06 DIAGNOSIS — M5481 Occipital neuralgia: Secondary | ICD-10-CM

## 2023-06-06 NOTE — Therapy (Signed)
OUTPATIENT PHYSICAL THERAPY CERVICAL TREATMENT   Patient Name: Todd [name] MRN: [id number] DOB:1951-03-12, 72 y.o., male Today's Date: 06/06/2023  END OF SESSION:  PT End of Session - 06/06/23 1105     Visit Number 4    Number of Visits 5   Plus eval   Date for PT Re-Evaluation 06/27/23   Due to delay in scheduling   Authorization Type BCBS Medicare    PT Start Time 1104   Pt requested to use restroom at beginning of session   PT Stop Time 1144    PT Time Calculation (min) 40 min    Activity Tolerance Patient tolerated treatment well    Behavior During Therapy [facility] for tasks assessed/performed               Past Medical History:  Diagnosis Date   Anxiety    Arthritis    Back pain    Fragments in vertebrae    Elevated cholesterol    Glaucoma    right worse than left   High cholesterol    Hypertension    [name] syndrome    Past Surgical History:  Procedure Laterality Date   KNEE ARTHROSCOPY Left 1984   Patient Active Problem List   Diagnosis Date Noted   Long term (current) use of systemic steroids 07/12/2022   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   Diplopia 01/12/2015   Headache 01/12/2015   Painful ophthalmoplegia of left eye 01/11/2015   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    PCP: [name], MD  REFERRING PROVIDER: Anson Fret, MD  REFERRING DIAG: M54.2 (ICD-10-CM) - Cervicalgia M54.81 (ICD-10-CM) - Bilateral occipital neuralgia  THERAPY DIAG:  Cervicalgia  Bilateral occipital neuralgia  Rationale for Evaluation and Treatment: Rehabilitation  ONSET DATE: 05/14/2023 (referral)  SUBJECTIVE:                                                                                                                                                                                                         SUBJECTIVE STATEMENT: "Todd [name]"   Pt reports that he feels about the same, pain  is 4/10 today. Neck continues to be tender. Pt reports his HEP is "moderate" but he is doing it.    PERTINENT HISTORY:  [name] Syndrome   PAIN:  Are you having pain? Yes: NPRS scale: 4/10 Pain location: bilateral neck (right side worse) Pain description: radiates to top of head Aggravating factors: rotating head Relieving factors: Heat  PRECAUTIONS: Fall    WEIGHT BEARING RESTRICTIONS: No  FALLS:  Has patient fallen in last 6 months? No  LIVING ENVIRONMENT: Lives with: lives with their spouse and lives with their daughter Lives in: House/apartment Stairs: Yes: Internal: 4 steps; on right going up Has following equipment at home: None  PLOF: Independent  PATIENT GOALS: "I am not too sure"   NEXT MD VISIT: 04/2024  OBJECTIVE:   DIAGNOSTIC FINDINGS:  MRI of brain from 05/2021  IMPRESSION: MRI scan of the brain without contrast showing mild changes of chronic small vessel disease which is age-appropriate and atrophy of the right optic nerve.  As compared with previous MRI from 08/02/2017 with age-related small vessel disease changes are expectedly slightly progressed.   No imaging of C-spine on file   PATIENT SURVEYS:  NDI 8/50 (mild disability)  COGNITION: Overall cognitive status: Within functional limits for tasks assessed  SENSATION: Pt denies numbness/tingling of his BUEs   POSTURE: rounded shoulders, forward head, and Head rotated to L side   PALPATION: No TTP noted    CERVICAL ROM: Tested in seated position. * = pain w/movement  Active ROM A/PROM (deg) eval  Flexion 55  Extension 32*  Right lateral flexion 22*  Left lateral flexion 30*  Right rotation 38*  Left rotation 45*   (Blank rows = not tested)  UPPER EXTREMITY ROM: All WNL   Active ROM Right eval Left eval  Shoulder flexion    Shoulder extension    Shoulder abduction    Shoulder adduction    Shoulder extension    Shoulder internal rotation    Shoulder external rotation     Elbow flexion    Elbow extension    Wrist flexion    Wrist extension    Wrist ulnar deviation    Wrist radial deviation    Wrist pronation    Wrist supination     (Blank rows = not tested)  TODAY'S TREATMENT:           Ther Ex      Supine sub occipital release on chirp wheel (rounded side) x5 minutes Progressed to adding horizontal and vertical head nods on wheel, x4 minutes.  Added chin tucks on chirp wheel, x15 reps w/2-3s hold  Seated assisted cervical rotation w/towel stretch, x2 minutes per side  Seated neck circles, x10 in each direction  Prone splenius capitis stretch, x8 per side.   Manual Therapy  Supine sub-occipital release, x5 minutes Supine upper trap stretch w/oscillation, x3 minutes per side Chin-tuck w/distraction x1 minute   Pt rated pain as 5/10 following session                                                               PATIENT EDUCATION:  Education details: Continue HEP, plan to DC next session  Person educated: Patient Education method: Explanation Education comprehension: verbalized understanding  HOME EXERCISE PROGRAM: Access Code: [email or web address] URL: [email or web address] Date: 05/23/2023 Prepared by: Camille Bal  Exercises - Sternocleidomastoid Stretch  - 1 x daily - 5 x weekly - 1 sets - 2-3 reps - 30-45 seconds hold - Scapular retraction with resistance  - 1 x daily - 4-5 x weekly - 2 sets - 12 reps - 2 seconds hold - Supine Cervical Retraction with Towel  - 1 x daily - 5 x weekly - 2-3  sets - 10 reps - 1-2 seconds hold - Supine Chin Tuck  - 1 x daily - 5-6 x weekly - 2-3 sets - 10 reps - 1-2 seconds hold  ASSESSMENT:  CLINICAL IMPRESSION: Emphasis of skilled PT session on suboccipital release, improved cervical ROM and pain modulation.  Pt continues to report increased sensitivity on R side of neck > L side but tolerated session well. Pt did report slight increase in neck pain at end of session, likely due to manual  therapy. Pt has overall improved his cervical mobility and has reduced pain levels since eval. Pt in agreement to DC next session due to improvement in pain and functional mobility. Continue POC.   OBJECTIVE IMPAIRMENTS: decreased activity tolerance, decreased ROM, increased muscle spasms, impaired vision/preception, and pain  ACTIVITY LIMITATIONS: sleeping and reach over head  PARTICIPATION LIMITATIONS: driving, community activity, and yard work  PERSONAL FACTORS: Fitness and Past/current experiences are also affecting patient's functional outcome.   REHAB POTENTIAL: Good  CLINICAL DECISION MAKING: Stable/uncomplicated  EVALUATION COMPLEXITY: Low   GOALS: Goals reviewed with patient? Yes  STG= LTG DUE TO POC LENGTH    LONG TERM GOALS: Target date: 06/13/2023    Pt will be independent with final HEP for improved strength, ROM and decreased pain   Baseline: not established on eval  Goal status: INITIAL  2.  Pt will improve NDI to </= 3/50 for reduced pain levels and improved QOL  Baseline: 8/50 Goal status: INITIAL  3.  Pt will improve cervical lateral flexion on left and right side by 10 degrees or greater for improved functional mobility and return to PLOF Baseline: 20 degrees on L, 22 degrees on R Goal status: INITIAL   PLAN:  PT FREQUENCY: 1x/week  PT DURATION: 4 weeks (POC written for 6 weeks due to delay in scheduling)   PLANNED INTERVENTIONS: Therapeutic exercises, Therapeutic activity, Neuromuscular re-education, Balance training, Gait training, Patient/Family education, Self Care, Joint mobilization, Joint manipulation, Vestibular training, Canalith repositioning, Aquatic Therapy, Dry Needling, Moist heat, Manual therapy, and Re-evaluation  PLAN FOR NEXT SESSION: Goals and DC   Jill Alexanders Tamim Skog, PT, DPT [facility] [address] [place], [place]  [address] Phone:  [phone] Fax:  [phone]   06/06/2023, 11:45 AM

## 2023-06-13 ENCOUNTER — Ambulatory Visit: Payer: Medicare Other | Admitting: Physical Therapy

## 2023-06-13 DIAGNOSIS — M5481 Occipital neuralgia: Secondary | ICD-10-CM

## 2023-06-13 DIAGNOSIS — M542 Cervicalgia: Secondary | ICD-10-CM | POA: Diagnosis not present

## 2023-06-13 NOTE — Therapy (Signed)
OUTPATIENT PHYSICAL THERAPY CERVICAL TREATMENT - DISCHARGE NOTE   Patient Name: Todd [name] MRN: [id number] DOB:June 13, 1951, 72 y.o., male Today's Date: 06/13/2023  PHYSICAL THERAPY DISCHARGE SUMMARY  Visits from Start of Care: 5  Current functional level related to goals / functional outcomes: 5   Remaining deficits: Decreased lateral cervical flexion   Education / Equipment: Handout for HEP   Patient agrees to discharge. Patient goals were partially met. Patient is being discharged due to being pleased with the current functional level.   END OF SESSION:  PT End of Session - 06/13/23 1021     Visit Number 5    Number of Visits 5   Plus eval   Date for PT Re-Evaluation 06/27/23   Due to delay in scheduling   Authorization Type BCBS Medicare    PT Start Time 1020    PT Stop Time 1036   d/c   PT Time Calculation (min) 16 min    Activity Tolerance Patient tolerated treatment well    Behavior During Therapy WFL for tasks assessed/performed                Past Medical History:  Diagnosis Date   Anxiety    Arthritis    Back pain    Fragments in vertebrae    Elevated cholesterol    Glaucoma    right worse than left   High cholesterol    Hypertension    [name] syndrome    Past Surgical History:  Procedure Laterality Date   KNEE ARTHROSCOPY Left 1984   Patient Active Problem List   Diagnosis Date Noted   Long term (current) use of systemic steroids 07/12/2022   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   Diplopia 01/12/2015   Headache 01/12/2015   Painful ophthalmoplegia of left eye 01/11/2015   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    PCP: [name], MD  REFERRING PROVIDER: Anson Fret, MD  REFERRING DIAG: M54.2 (ICD-10-CM) - Cervicalgia M54.81 (ICD-10-CM) - Bilateral occipital neuralgia  THERAPY DIAG:  Cervicalgia  Bilateral occipital neuralgia  Rationale for Evaluation  and Treatment: Rehabilitation  ONSET DATE: 05/14/2023 (referral)  SUBJECTIVE:                                                                                                                                                                                                         SUBJECTIVE STATEMENT: "Todd [name]"   Pt reports he feels like his pain is doing much better, feels like exercises are helpful. Pt reports 2/10  pain in neck today. Pt reports he is no longer having headaches.   PERTINENT HISTORY:  [name] Syndrome   PAIN:  Are you having pain? Yes: NPRS scale: 4/10 Pain location: bilateral neck (right side worse) Pain description: radiates to top of head Aggravating factors: rotating head Relieving factors: Heat  PRECAUTIONS: Fall    WEIGHT BEARING RESTRICTIONS: No  FALLS:  Has patient fallen in last 6 months? No  LIVING ENVIRONMENT: Lives with: lives with their spouse and lives with their daughter Lives in: House/apartment Stairs: Yes: Internal: 4 steps; on right going up Has following equipment at home: None  PLOF: Independent  PATIENT GOALS: "I am not too sure"   NEXT MD VISIT: 04/2024  OBJECTIVE:   DIAGNOSTIC FINDINGS:  MRI of brain from 05/2021  IMPRESSION: MRI scan of the brain without contrast showing mild changes of chronic small vessel disease which is age-appropriate and atrophy of the right optic nerve.  As compared with previous MRI from 08/02/2017 with age-related small vessel disease changes are expectedly slightly progressed.   No imaging of C-spine on file   PATIENT SURVEYS:  NDI 8/50 (mild disability)  COGNITION: Overall cognitive status: Within functional limits for tasks assessed  SENSATION: Pt denies numbness/tingling of his BUEs   POSTURE: rounded shoulders, forward head, and Head rotated to L side   PALPATION: No TTP noted    CERVICAL ROM: Tested in seated position. * = pain w/movement  Active ROM A/PROM (deg) eval  Flexion  55  Extension 32*  Right lateral flexion 22*  Left lateral flexion 30*  Right rotation 38*  Left rotation 45*   (Blank rows = not tested)  UPPER EXTREMITY ROM: All WNL   Active ROM Right eval Left eval  Shoulder flexion    Shoulder extension    Shoulder abduction    Shoulder adduction    Shoulder extension    Shoulder internal rotation    Shoulder external rotation    Elbow flexion    Elbow extension    Wrist flexion    Wrist extension    Wrist ulnar deviation    Wrist radial deviation    Wrist pronation    Wrist supination     (Blank rows = not tested)  TODAY'S TREATMENT:           Ther Ex      Seated B UT stretch 2 x 30 sec each Seated levator scap stretch 2 x 30 sec each  Added to HEP, see bolded below    TherAct CERVICAL ROM: Tested in seated position. * = pain w/movement  Active ROM A/PROM (deg) eval 06/13/23 (Deg) discharge  Flexion 55   Extension 32*   Right lateral flexion 22* 23*  Left lateral flexion 20* 21*  Right rotation 38*   Left rotation 45*    (Blank rows = not tested)                                                               PATIENT EDUCATION:  Education details: Continue HEP, added to HEP Person educated: Patient Education method: Explanation, Demonstration, and Handouts Education comprehension: verbalized understanding and returned demonstration  HOME EXERCISE PROGRAM: Access Code: [email or web address] URL: [email or web address] Date: 05/23/2023 Prepared by: Camille Bal  Exercises - Sternocleidomastoid Stretch  -  1 x daily - 5 x weekly - 1 sets - 2-3 reps - 30-45 seconds hold - Scapular retraction with resistance  - 1 x daily - 4-5 x weekly - 2 sets - 12 reps - 2 seconds hold - Supine Cervical Retraction with Towel  - 1 x daily - 5 x weekly - 2-3 sets - 10 reps - 1-2 seconds hold - Supine Chin Tuck  - 1 x daily - 5-6 x weekly - 2-3 sets - 10 reps - 1-2 seconds hold - Seated Upper Trapezius Stretch  - 1 x daily -  5-6 x weekly - 1 sets - 3-5 reps - 30 sec hold - Gentle Levator Scapulae Stretch  - 1 x daily - 5-6 x weekly - 1 sets - 3-5 reps - 30 sec hold  ASSESSMENT:  CLINICAL IMPRESSION: Emphasis of skilled PT session on assessing LTG in preparation for d/c from OPPT services this date as well as adding 2 stretches to HEP to address ongoing limitations regarding lateral cervical flexion as noted above. Pt has met 1/3 LTG due to being independent with his final HEP. He did improve lateral cervical flexion though only by 1 degree and did improve his score on the NDI to demonstrate decreased disability level, however he did not meet goals set. Pt is pleased with his progress and reduction in his pain and agreeable to d/c from OPPT services this date.   OBJECTIVE IMPAIRMENTS: decreased activity tolerance, decreased ROM, increased muscle spasms, impaired vision/preception, and pain  ACTIVITY LIMITATIONS: sleeping and reach over head  PARTICIPATION LIMITATIONS: driving, community activity, and yard work  PERSONAL FACTORS: Fitness and Past/current experiences are also affecting patient's functional outcome.   REHAB POTENTIAL: Good  CLINICAL DECISION MAKING: Stable/uncomplicated  EVALUATION COMPLEXITY: Low   GOALS: Goals reviewed with patient? Yes  STG= LTG DUE TO POC LENGTH    LONG TERM GOALS: Target date: 06/13/2023   Pt will be independent with final HEP for improved strength, ROM and decreased pain   Baseline: not established on eval  Goal status:  MET  2.  Pt will improve NDI to </= 3/50 for reduced pain levels and improved QOL  Baseline: 8/50, 5/50 (9/18) Goal status: NOT MET  3.  Pt will improve cervical lateral flexion on left and right side by 10 degrees or greater for improved functional mobility and return to PLOF Baseline: 20 degrees on L, 22 degrees on R; 21 on L and 23 on R (9/18) Goal status: NOT MET      Peter Congo, PT, DPT, [facility] [address] [place], [place]  [address] Phone:  [phone] Fax:  [phone]   06/13/2023, 10:37 AM

## 2023-07-23 ENCOUNTER — Other Ambulatory Visit: Payer: Self-pay | Admitting: Neurology

## 2023-07-25 NOTE — Telephone Encounter (Signed)
Pt checking on refill request. Pt said is out of medication.

## 2023-08-03 ENCOUNTER — Ambulatory Visit
Admission: RE | Admit: 2023-08-03 | Discharge: 2023-08-03 | Disposition: A | Discharge status: Home / Self Care | Payer: Medicare Other | Source: Ambulatory Visit | Attending: Family Medicine | Admitting: Family Medicine

## 2023-08-03 ENCOUNTER — Other Ambulatory Visit: Payer: Self-pay | Admitting: Family Medicine

## 2023-08-03 DIAGNOSIS — M542 Cervicalgia: Secondary | ICD-10-CM

## 2023-08-03 LAB — LAB REPORT - SCANNED
A1c: 6.5
Albumin, Urine POC: <0.7
Creatinine, POC: 92 mg/dL
EGFR: 74
Microalb Creat Ratio: <7.6

## 2023-08-17 ENCOUNTER — Other Ambulatory Visit: Payer: Self-pay | Admitting: Family Medicine

## 2023-08-17 DIAGNOSIS — M1990 Unspecified osteoarthritis, unspecified site: Secondary | ICD-10-CM

## 2023-09-14 ENCOUNTER — Other Ambulatory Visit: Payer: Medicare Other

## 2023-09-21 ENCOUNTER — Ambulatory Visit
Admission: RE | Admit: 2023-09-21 | Discharge: 2023-09-21 | Disposition: A | Discharge status: Home / Self Care | Payer: Medicare Other | Source: Ambulatory Visit | Attending: Family Medicine | Admitting: Family Medicine

## 2023-09-21 DIAGNOSIS — M1990 Unspecified osteoarthritis, unspecified site: Secondary | ICD-10-CM

## 2023-10-23 ENCOUNTER — Other Ambulatory Visit: Payer: Self-pay | Admitting: Neurology

## 2023-11-19 ENCOUNTER — Ambulatory Visit (HOSPITAL_COMMUNITY)
Admission: RE | Admit: 2023-11-19 | Discharge: 2023-11-19 | Disposition: A | Discharge status: Home / Self Care | Payer: Medicare Other | Source: Ambulatory Visit | Attending: Family Medicine | Admitting: Family Medicine

## 2023-11-19 ENCOUNTER — Other Ambulatory Visit (HOSPITAL_COMMUNITY): Payer: Self-pay | Admitting: Family Medicine

## 2023-11-19 DIAGNOSIS — R0602 Shortness of breath: Secondary | ICD-10-CM | POA: Insufficient documentation

## 2023-11-19 DIAGNOSIS — I82401 Acute embolism and thrombosis of unspecified deep veins of right lower extremity: Secondary | ICD-10-CM

## 2023-11-19 DIAGNOSIS — R609 Edema, unspecified: Secondary | ICD-10-CM | POA: Diagnosis not present

## 2023-11-19 MED ORDER — IOHEXOL 350 MG/ML SOLN
75.0000 mL | Freq: Once | INTRAVENOUS | Status: AC | PRN
  Administered 2023-11-19: 75 mL via INTRAVENOUS

## 2023-11-19 NOTE — Progress Notes (Signed)
 RLE venous duplex has been completed.  Preliminary findings given to [facility] at Dr. Alver Fisher office. Per Leavy Cella, Dr. Clelia Croft will contact patient directly for further treatment, ok to leave.   Results can be found under chart review under CV PROC. 11/19/2023 2:02 PM Samatha Anspach RVT, RDMS

## 2023-11-20 ENCOUNTER — Ambulatory Visit (HOSPITAL_COMMUNITY)
Admission: RE | Admit: 2023-11-20 | Discharge: 2023-11-20 | Disposition: A | Discharge status: Home / Self Care | Payer: Medicare Other | Source: Ambulatory Visit | Attending: Vascular Surgery | Admitting: Vascular Surgery

## 2023-11-20 ENCOUNTER — Other Ambulatory Visit (HOSPITAL_COMMUNITY): Payer: Self-pay

## 2023-11-20 ENCOUNTER — Other Ambulatory Visit: Payer: Self-pay | Admitting: Family Medicine

## 2023-11-20 ENCOUNTER — Encounter (HOSPITAL_COMMUNITY): Payer: Self-pay | Admitting: Student-PharmD

## 2023-11-20 ENCOUNTER — Other Ambulatory Visit (HOSPITAL_COMMUNITY): Payer: Self-pay | Admitting: Family Medicine

## 2023-11-20 VITALS — BP 142/90 | HR 87

## 2023-11-20 DIAGNOSIS — R590 Localized enlarged lymph nodes: Secondary | ICD-10-CM

## 2023-11-20 DIAGNOSIS — I82411 Acute embolism and thrombosis of right femoral vein: Secondary | ICD-10-CM | POA: Diagnosis not present

## 2023-11-20 DIAGNOSIS — N281 Cyst of kidney, acquired: Secondary | ICD-10-CM

## 2023-11-20 LAB — CBC
HCT: 43.1 % (ref 39.0–52.0)
Hemoglobin: 13.9 g/dL (ref 13.0–17.0)
MCH: 31.2 pg (ref 26.0–34.0)
MCHC: 32.3 g/dL (ref 30.0–36.0)
MCV: 96.9 fL (ref 80.0–100.0)
Platelets: 225 10*3/uL (ref 150–400)
RBC: 4.45 MIL/uL (ref 4.22–5.81)
RDW: 13.1 % (ref 11.5–15.5)
WBC: 5.1 10*3/uL (ref 4.0–10.5)
nRBC: 0 % (ref 0.0–0.2)

## 2023-11-20 LAB — BASIC METABOLIC PANEL
Anion gap: 7 (ref 5–15)
BUN: 12 mg/dL (ref 8–23)
CO2: 25 mmol/L (ref 22–32)
Calcium: 9.1 mg/dL (ref 8.9–10.3)
Chloride: 105 mmol/L (ref 98–111)
Creatinine, Ser: 1.1 mg/dL (ref 0.61–1.24)
GFR, Estimated: >60 mL/min (ref >60)
Glucose, Bld: 102 mg/dL — ABNORMAL HIGH (ref 70–99)
Potassium: 4.3 mmol/L (ref 3.5–5.1)
Sodium: 137 mmol/L (ref 135–145)

## 2023-11-20 MED ORDER — APIXABAN 5 MG PO TABS
5.0000 mg | ORAL_TABLET | Freq: Two times a day (BID) | ORAL | 5 refills | Status: DC
  Filled 2023-11-20 – 2023-12-10 (×2): qty 60, 30d supply, fill #0
  Filled 2024-01-16: qty 180, 90d supply, fill #1
  Filled 2024-04-26: qty 60, 30d supply, fill #2

## 2023-11-20 MED ORDER — APIXABAN (ELIQUIS) VTE STARTER PACK (10MG AND 5MG)
ORAL_TABLET | ORAL | 0 refills | Status: DC
  Filled 2023-11-20: qty 74, 30d supply, fill #0

## 2023-11-20 NOTE — Progress Notes (Signed)
 Oley Balm, MD  Claudean Kinds PROCEDURE / BIOPSY REVIEW Date: 11/20/23  Requested Biopsy site: L ax LAN Reason for request: met v lymphoma Imaging review: Best seen on CT 11/19/23  Decision: Approved Imaging modality to perform: Ultrasound Schedule with: No sedation / Local anesthetic Schedule for: Any VIR  Additional comments: @VIR : put in saline  Please contact me with questions, concerns, or if issue pertaining to this request arise.  Dayne Oley Balm, MD Vascular and Interventional Radiology Specialists [facility] Radiology       Previous Messages    ----- Message ----- From: Claudean Kinds Sent: 11/20/2023  11:27 AM EST To: Claudean Kinds; Ir Procedure Requests Subject: [place] AXILLARY NODE CORE BIOPSY LEFT              Procedure : [place] AXILLARY NODE CORE BIOPSY LEFT  Reason : IR for biopsy of left axillary enlarged lymph node Dx: LAD (lymphadenopathy), axillary [R59.0 (ICD-10-CM)]    History : Ct Angio chest PE w/wo , MR cervical spine w/o , DG cervical spine complete  Provider : [name], MD  Provider contact : Phone Icon [phone]

## 2023-11-20 NOTE — Patient Instructions (Addendum)
-  Start apixaban (Eliquis) 10 mg twice daily for 7 days followed by 5 mg twice daily. -Your refills have been sent to our [facility] for free home delivery. You may need to call the pharmacy to ask them to fill this when you start to run low on your current supply.  -We are referring you to see a hematologist. If you have not heard from them by the end of the week, please call [phone] to schedule your appointment. Their office is located in the [facility].  -We recommend you wear compression stockings (15-20 mmHg) as long as you are having swelling or pain. Be sure to purchase the correct size and take them off at night. Start with thigh high compression stockings until your thigh swelling improves, then you can switch to the knee high stockings. Continue compression and elevation as long as you have any swelling in your leg.  -It is important to take your medication around the same time every day.  -Avoid NSAIDs like ibuprofen (Advil, Motrin) and naproxen (Aleve) as well as aspirin doses over 100 mg daily. -Tylenol (acetaminophen) is the preferred over the counter pain medication to lower the risk of bleeding. -Be sure to alert all of your health care providers that you are taking an anticoagulant prior to starting a new medication or having a procedure. -Monitor for signs and symptoms of bleeding (abnormal bruising, prolonged bleeding, nose bleeds, bleeding from gums, discolored urine, black tarry stools). If you have fallen and hit your head OR if your bleeding is severe or not stopping, seek emergency care.  -Go to the emergency room if emergent signs and symptoms of new clot occur (new or worse swelling and pain in an arm or leg, shortness of breath, chest pain, fast or irregular heartbeats, lightheadedness, dizziness, fainting, coughing up blood) or if you experience a significant color change (pale or blue) in the extremity that has the DVT.   If you have any  questions or need to reschedule an appointment, please call [phone] [facility].  If you are having an emergency, call 911 or present to the nearest emergency room.   What is a DVT?  -Deep vein thrombosis (DVT) is a condition in which a blood clot forms in a vein of the deep venous system which can occur in the lower leg, thigh, pelvis, arm, or neck. This condition is serious and can be life-threatening if the clot travels to the arteries of the lungs and causing a blockage (pulmonary embolism, PE). A DVT can also damage veins in the leg, which can lead to long-term venous disease, leg pain, swelling, discoloration, and ulcers or sores (post-thrombotic syndrome).  -Treatment may include taking an anticoagulant medication to prevent more clots from forming and the current clot from growing, wearing compression stockings, and/or surgical procedures to remove or dissolve the clot.

## 2023-11-20 NOTE — Progress Notes (Signed)
 DVT Clinic Note  Name: Todd [name]     MRN: [id number]     DOB: 1951/02/19     Sex: male  PCP: [name], MD  Today's Visit: Visit Information: Initial Visit  Referred to DVT Clinic by: Primary Care - Dr. Clelia Croft [facility]) Referred to CPP by: Dr. Randie Heinz Reason for referral:  Chief Complaint  Patient presents with   DVT   HISTORY OF PRESENT ILLNESS: Todd [name] is a 73 y.o. male with PMH HTN, HLD, prediabetes, BPH, osteoporosis, varicose veins, venous insufficiency, [name] syndrome, who presents after diagnosis of DVT for medication management. Patient developed RLE edema and was seen by his PCP yesterday on 11/19/23. Ultrasound 11/19/23 showed acute DVT in his right femoral, popliteal, posterior tibial, and peroneal veins. The patient was experiencing SOB and was sent for stat CT which was negative for PE. His PCP's office sent a stat referral to DVT Clinic to initiate treatment.   Today, patient arrives ambulating well independently and accompanied by his wife and daughter. Reports that his swelling started about 3 weeks ago. Reports that he was wearing compression stockings and elevating his legs but it did not improve the swelling at all. His calf has been cramping and tender. Has history of thrombophlebitis but no history of DVT. He reports being up to date on cancer screenings. No unintentional weight loss.   Positive Thrombotic Risk Factors: Older Age Bleeding Risk Factors: Age >65 years, Antiplatelet therapy  Negative Thrombotic Risk Factors: Previous VTE, Sedentary journey lasting >8 hours within 4 weeks, Recent surgery (within 3 months), Recent trauma (within 3 months), Recent admission to hospital with acute illness (within 3 months), Paralysis, paresis, or recent plaster cast immobilization of lower extremity, Central venous catheterization, Pregnancy, Bed rest >72 hours within 3 months, Within 6 weeks postpartum, Recent cesarean section  (within 3 months), Estrogen therapy, Testosterone therapy, Erythropoiesis-stimulating agent, Recent COVID diagnosis (within 3 months), Active cancer, Non-malignant, chronic inflammatory condition, Known thrombophilic condition, Smoking, Obesity  Rx Insurance Coverage: Medicare Rx Affordability: Eliquis is $45/month or $135 per 90 day supply. One-time free copay card has already been used in the past so we were unable to use it today.  Preferred Pharmacy: Eliquis starter pack filled at [facility] during visit. Refills sent to [facility] for home delivery, scheduled to drop in time for him to receive it before he runs out of the starter pack.  Past Medical History:  Diagnosis Date   Anxiety    Arthritis    Back pain    Fragments in vertebrae    Elevated cholesterol    Glaucoma    right worse than left   High cholesterol    Hypertension    [name] syndrome     Past Surgical History:  Procedure Laterality Date   KNEE ARTHROSCOPY Left 1984    Social History   Socioeconomic History   Marital status: Married    Spouse name: Not on file   Number of children: 4   Years of education: Bachelor's   Highest education level: Not on file  Occupational History   Occupation: [employer]  Tobacco Use   Smoking status: Former    Current packs/day: 0.00    Types: Cigarettes    Start date: 09/25/1966    Quit date: 09/25/1976    Years since quitting: 47.1   Smokeless tobacco: Never  Vaping Use   Vaping status: Never Used  Substance and Sexual  Activity   Alcohol use: Yes    Comment: occ   Drug use: No   Sexual activity: Not on file  Other Topics Concern   Not on file  Social History Narrative   Lives at home with wife.   Right handed.   Caffeine use: 1 cup coffee per day.   Occasionally drinks soda and/or tea    Social Drivers of Corporate investment banker Strain: Not on file  Food Insecurity: Not on file  Transportation Needs: Not on  file  Physical Activity: Not on file  Stress: Not on file  Social Connections: Not on file  Intimate Partner Violence: Not on file    Family History  Problem Relation Age of Onset   Glaucoma Mother    Pancreatic cancer Mother    Glaucoma Father    Migraines Neg Hx    Headache Neg Hx     Allergies as of 11/20/2023 - Review Complete 11/20/2023  Allergen Reaction Noted   Multihance [gadobenate] Nausea And Vomiting 08/02/2017   Rosuvastatin Other (See Comments) 11/20/2023   Simvastatin Other (See Comments) 11/20/2023   Alphagan p [brimonidine tartrate] Other (See Comments) 12/29/2014   Lipitor [atorvastatin] Other (See Comments) 01/04/2015    Current Outpatient Medications on File Prior to Encounter  Medication Sig Dispense Refill   acetaminophen (TYLENOL) 650 MG CR tablet Take 650 mg by mouth every 8 (eight) hours as needed for pain.     ascorbic acid (VITAMIN C) 500 MG tablet Take 500 mg by mouth daily.     aspirin 81 MG tablet Take 81 mg by mouth daily.     calcium carbonate (OS-CAL - DOSED IN MG OF ELEMENTAL CALCIUM) 1250 (500 Ca) MG tablet Take 1 tablet by mouth daily with breakfast.     Cholecalciferol (VITAMIN D3) 50 MCG (2000 UT) CAPS Take 1 capsule by mouth daily at 6 (six) AM.     diclofenac Sodium (VOLTAREN) 1 % GEL as directed Externally As needed     dorzolamide-timolol (COSOPT) 22.3-6.8 MG/ML ophthalmic solution Place 1 drop into both eyes 2 (two) times daily.     ezetimibe (ZETIA) 10 MG tablet Take 10 mg by mouth every evening.     famotidine-calcium carbonate-magnesium hydroxide (PEPCID COMPLETE) 10-800-165 MG chewable tablet Chew 1 tablet by mouth daily as needed (indigestion).     LUMIGAN 0.01 % SOLN Place 1 drop into both eyes daily. At bedtime     Multiple Vitamin (MULTIVITAMIN WITH MINERALS) TABS tablet Take 1 tablet by mouth daily.     Omega-3 Fatty Acids (FISH OIL) 1200 MG CAPS Take 1 capsule by mouth daily.     predniSONE (DELTASONE) 5 MG tablet Take 1 tablet  (5 mg total) by mouth daily with breakfast. (Patient taking differently: Take 5 mg by mouth daily as needed (headache syndrom per neurology).) 90 tablet 4   predniSONE (DELTASONE) 5 MG tablet TAKE 1 TABLET(5 MG) BY MOUTH DAILY 30 tablet 1   triamcinolone cream (KENALOG) 0.5 % Apply 1 Application topically daily as needed (itching).     UNABLE TO FIND Med Name: Tumreic powder 1/4 teaspoon daily     No current facility-administered medications on file prior to encounter.   REVIEW OF SYSTEMS:  Review of Systems  Respiratory:  Positive for shortness of breath.   Cardiovascular:  Positive for leg swelling. Negative for chest pain and palpitations.  Musculoskeletal:  Positive for myalgias.  Neurological:  Positive for tingling. Negative for dizziness.   PHYSICAL EXAMINATION:  Vitals:   11/20/23 1414  BP: (!) 142/90  Pulse: 87  SpO2: 100%   Physical Exam Vitals reviewed.  Cardiovascular:     Rate and Rhythm: Normal rate.  Pulmonary:     Effort: Pulmonary effort is normal.  Musculoskeletal:        General: Tenderness present.     Right lower leg: Edema present.     Left lower leg: No edema.  Skin:    Findings: Erythema present. No bruising.   Villalta Score for Post-Thrombotic Syndrome: Pain: Severe Cramps: Severe Heaviness: Severe Paresthesia: Moderate Pruritus: Severe Pretibial Edema: Severe Skin Induration: Absent Hyperpigmentation: Absent Redness: Moderate Venous Ectasia: Absent Pain on calf compression: Moderate Villalta Preliminary Score: 21 Is venous ulcer present?: No If venous ulcer is present and score is <15, then 15 points total are assigned: Absent Villalta Total Score: 21  LABS:  CBC     Component Value Date/Time   WBC 5.1 11/20/2023 1508   RBC 4.45 11/20/2023 1508   HGB 13.9 11/20/2023 1508   HGB 14.3 07/17/2017 1025   HCT 43.1 11/20/2023 1508   HCT 44.3 07/17/2017 1025   PLT 225 11/20/2023 1508   PLT 243 07/17/2017 1025   MCV 96.9 11/20/2023 1508    MCV 97 07/17/2017 1025   MCH 31.2 11/20/2023 1508   MCHC 32.3 11/20/2023 1508   RDW 13.1 11/20/2023 1508   RDW 13.7 07/17/2017 1025   LYMPHSABS 1.6 07/17/2017 1025   MONOABS 0.4 12/29/2014 1347   EOSABS 1.0 (H) 07/17/2017 1025   BASOSABS 0.0 07/17/2017 1025    Hepatic Function      Component Value Date/Time   PROT 7.0 07/17/2017 1025   ALBUMIN 4.1 07/17/2017 1025   AST 15 07/17/2017 1025   ALT 19 07/17/2017 1025   ALKPHOS 53 07/17/2017 1025   BILITOT 0.4 07/17/2017 1025    Renal Function   Lab Results  Component Value Date   CREATININE 1.02 07/17/2017   CREATININE 1.05 11/04/2015   CREATININE 1.03 02/03/2015    [facility] Vascular Lab Studies:  11/19/23 VAS [place] LOWER EXTREMITY VENOUS (DVT) RIGHT Summary:  RIGHT:  - Findings consistent with acute deep vein thrombosis involving the right  femoral vein, right popliteal vein, right posterior tibial veins, and  right peroneal veins.  - No cystic structure found in the popliteal fossa.   LEFT:  - No evidence of common femoral vein obstruction.   11/19/23 CT Angio Chest Pulmonary Embolism IMPRESSION: 1. No filling defect is identified in the pulmonary arterial tree to suggest pulmonary embolus. Reduced negative predictive value in the lung bases due to breathing motion artifact. 2. Pathologically enlarged left axillary and subpectoral lymph nodes. Possible enlarged upper abdominal lymph node at the level of the renal vein. Findings raise suspicion for neoplasm, such as lymphoproliferative disease or potentially metastatic disease. There is bilateral gynecomastia but I do not see a overt focal breast mass. 3. Airway thickening is present, suggesting bronchitis or reactive airways disease. 4. Complex cystic lesion of the right kidney upper pole with higher density cephalad component. This could be from enhancing nodularity although proteinaceous or complex cystic lesion is not excluded. Dedicated renal protocol CT or MRI with  and without contrast is recommended for further characterization. 5. Bilateral degenerative glenohumeral arthropathy. 6. Small gastric diverticulum. 7. Capsular calcification laterally along the spleen, possibly from remote injury.  ASSESSMENT: Location of DVT: Right femoral vein, Right popliteal vein, Right distal vein Cause of DVT: unprovoked  Patient with first incidence of  DVT involving the right femoral, popliteal, posterior tibial, and peroneal veins. He was having SOB at the time of his [place] yesterday so he was sent for CT which was negative for PE. Will initiate anticoagulation with Eliquis starter pack. Checked updated CBC and BMET today. CBC has resulted and is WNL. BMET pending. Counseled patient extensively on Eliquis. Provided refills of Eliquis to his preferred pharmacy. No concerns related to medication adherence or access at this time. Discussed the importance of compression and elevation. All of his questions have been answered at this time.   His PCP called me this morning to explain the patient's history of vasculitis and venous insufficiency. He was seen by [facility] rheumatology in 2023 for workup of this. 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 His PCP was wondering if this history could be related to his new diagnosis of DVT. Patient's CT yesterday also showed multiple pathologically enlarged lymph nodes, raising suspicion for neoplasm such as lymphoproliferative disease or potentially metastatic disease. Discussed patient with vascular surgeon Dr. Randie Heinz who agrees with referral to heme/onc for further hypercoagulable workup of unprovoked DVT and for these new findings on CT. No need for further vascular surgery intervention since clot is only as proximal as the femoral vein.   PLAN: -Start apixaban (Eliquis) 10 mg twice daily for 7 days followed by 5 mg twice daily. -Expected  duration of therapy: per hematology. Therapy started on 11/20/23. -Patient educated on purpose, proper use and potential adverse effects of apixaban (Eliquis). -Discussed importance of taking medication around the same time every day. -Advised patient of medications to avoid (NSAIDs, aspirin doses >100 mg daily). -Educated that Tylenol (acetaminophen) is the preferred analgesic to lower the risk of bleeding. -Advised patient to alert all providers of anticoagulation therapy prior to starting a new medication or having a procedure. -Emphasized importance of monitoring for signs and symptoms of bleeding (abnormal bruising, prolonged bleeding, nose bleeds, bleeding from gums, discolored urine, black tarry stools). -Educated patient to present to the ED if emergent signs and symptoms of new thrombosis occur. -Counseled patient to wear compression stockings daily, removing at night. Encouraged elevation as well to help improve swelling.   Follow up: Referral to hematology placed. DVT Clinic available as needed.   Pervis Hocking, PharmD, [place], CPP Deep Vein Thrombosis Clinic Clinical Pharmacist Practitioner Office: [phone]

## 2023-11-22 ENCOUNTER — Telehealth (HOSPITAL_COMMUNITY): Payer: Self-pay | Admitting: Student-PharmD

## 2023-11-22 NOTE — Telephone Encounter (Signed)
 Patient called reporting a nosebleed last night that started while he was blowing his nose. Reports that this stopped immediately upon holding pressure. He tells me that he was "born with a condition" that causes nosebleeds. Unclear what this is, but he says the last time he had a nosebleed was 30-40 years ago. Asks if this can be related to his recent Eliquis start. Explained that Eliquis can increase the risk of bleeding, including nosebleeds, especially if he is predisposed to them. Encouraged him to keep nasal passages well-hydrated which he can do with a saline nasal spray or even vaseline if needed. Reassured that the bleeding stopped immediately. He is on 10 mg BID of Eliquis for this first week then will decrease to 5 mg BID which should lower his risk of nosebleeds some. Encouraged him to call back if nosebleeds are becoming bothersome or more frequent or if any other questions come up.

## 2023-12-10 ENCOUNTER — Other Ambulatory Visit: Payer: Self-pay

## 2023-12-10 ENCOUNTER — Other Ambulatory Visit (HOSPITAL_COMMUNITY): Payer: Self-pay

## 2023-12-12 ENCOUNTER — Ambulatory Visit (HOSPITAL_COMMUNITY)
Admission: RE | Admit: 2023-12-12 | Discharge: 2023-12-12 | Disposition: A | Discharge status: Home / Self Care | Source: Ambulatory Visit | Attending: Family Medicine | Admitting: Family Medicine

## 2023-12-12 DIAGNOSIS — R590 Localized enlarged lymph nodes: Secondary | ICD-10-CM | POA: Insufficient documentation

## 2023-12-12 MED ORDER — LIDOCAINE HCL 1 % IJ SOLN
INTRAMUSCULAR | Status: AC
  Filled 2023-12-12: qty 20

## 2023-12-12 NOTE — Procedures (Signed)
 Vascular and Interventional Radiology Procedure Note  Patient: KNOAH NEDEAU DOB: 1951/04/12 Medical Record Number: [id number] Note Date/Time: 12/12/23 1:25 PM   Performing Physician: Roanna Banning, MD Assistant(s): None  Diagnosis: L axillary adenopathy. No DX   Procedure: LEFT AXILLARY LYMPH NODE BIOPSY  Anesthesia: Local Anesthetic Complications: None Estimated Blood Loss: Minimal Specimens: Sent for Pathology  Findings:  Successful Ultrasound-guided biopsy of L axillary LNs. A total of 4 samples were obtained. Hemostasis of the tract was achieved using Manual Pressure.  Plan: Bed rest for 1 hours.  See detailed procedure note with images in PACS. The patient tolerated the procedure well without incident or complication and was returned to Recovery in stable condition.    Roanna Banning, MD Vascular and Interventional Radiology Specialists [facility] Radiology   Pager. [phone] Clinic. [phone]

## 2023-12-14 LAB — SURGICAL PATHOLOGY

## 2023-12-17 ENCOUNTER — Encounter: Payer: Self-pay | Admitting: Family Medicine

## 2023-12-22 ENCOUNTER — Ambulatory Visit
Admission: RE | Admit: 2023-12-22 | Discharge: 2023-12-22 | Disposition: A | Discharge status: Home / Self Care | Payer: Medicare Other | Source: Ambulatory Visit | Attending: Family Medicine | Admitting: Family Medicine

## 2023-12-22 DIAGNOSIS — N281 Cyst of kidney, acquired: Secondary | ICD-10-CM

## 2023-12-25 NOTE — Progress Notes (Unsigned)
 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  03/15/2016: Ultrasound of the right upper extremity showed occlusive thrombosis in the cephalic vein   11/19/2023: US showed findings consistent with acute deep vein thrombosis involving the right femoral vein, right popliteal vein, right posterior tibial veins, and right peroneal veins 12/24/2023: Lymph node biopsy of the left axilla showed findings concerning for B-cell lymphoma. 12/26/2023: Establish care with Todd [name].  CHIEF COMPLAINTS/PURPOSE OF CONSULTATION:  "B Cell Lymphoma of Left Axilla "  HISTORY OF PRESENTING ILLNESS:  Todd [name] 73 y.o. 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  On review of the previous records Todd [name] underwent an ultrasound of his right lower extremity on 11/19/2023 which showed findings consistent with acute deep vein thrombosis involving the right femoral vein, right popliteal vein, right posterior tibial veins, and right peroneal veins.  Of note he underwent an ultrasound of his right upper extremity and June 2017 which showed right upper extremity showed occlusive thrombosis in the cephalic vein.  Due to concern for his recent VTE was referred to hematology for further evaluation management.  In the interim since that time he underwent a biopsy of enlarged left axillary lymph node with findings consistent and concerning for a B-cell lymphoma.  On exam today Todd [name] reports that he was at his usual level of health he began developing edema and pain in his right leg.  He  reports he always has some swelling and wears compression socks.  He is having pain in his calf muscle and the ultrasound showed a blood clot.  He reports this is the first blood clot he is had in his legs and does report the prior superficial VTE in his upper extremity previously.  He is had no recent illness or prolonged travel.  He is had no recent immobility.  He reports this came "out of the blue".  On further discussion he reports that his mother had pancreatic cancer in his father passed away of old age.  He reports he has an older brother with glaucoma.  He has 4 children who are healthy.  He is a former smoker having quit 9 years ago.  He drinks alcohol on social occasions.  He reports he previously worked in [employer] with [employer].  He notes that he did not personally notice the left axilla lymphadenopathy but was found by his primary care provider who referred him for the biopsy.  He otherwise denies any fevers, chills, sweats, nausea, vomiting or diarrhea.  A full 10 point ROS is otherwise negative.  MEDICAL HISTORY:  Past Medical History:  Diagnosis Date   Anxiety    Arthritis    Back pain    Fragments in vertebrae    Elevated cholesterol    Glaucoma    right worse than left   High cholesterol    Hypertension    [name] syndrome     SURGICAL HISTORY: Past Surgical History:  Procedure Laterality Date   KNEE ARTHROSCOPY Left 1984    SOCIAL HISTORY: Social History   Socioeconomic History   Marital status: Married    Spouse name: Not on file  Number of children: 4   Years of education: Bachelor's   Highest education level: Not on file  Occupational History   Occupation: [employer]  Tobacco Use   Smoking status: Former    Current packs/day: 0.00    Types: Cigarettes    Start date: 09/25/1966    Quit date: 09/25/1976    Years since quitting: 47.2   Smokeless tobacco: Never  Vaping Use   Vaping status: Never Used  Substance and Sexual  Activity   Alcohol use: Yes    Comment: occ   Drug use: No   Sexual activity: Not on file  Other Topics Concern   Not on file  Social History Narrative   Lives at home with wife.   Right handed.   Caffeine use: 1 cup coffee per day.   Occasionally drinks soda and/or tea    Social Drivers of Corporate investment banker Strain: Not on file  Food Insecurity: Not on file  Transportation Needs: Not on file  Physical Activity: Not on file  Stress: Not on file  Social Connections: Not on file  Intimate Partner Violence: Not on file    FAMILY HISTORY: Family History  Problem Relation Age of Onset   Glaucoma Mother    Pancreatic cancer Mother    Glaucoma Father    Migraines Neg Hx    Headache Neg Hx     ALLERGIES:  is allergic to multihance [gadobenate], rosuvastatin, simvastatin, alphagan p [brimonidine tartrate], and lipitor [atorvastatin].  MEDICATIONS:  Current Outpatient Medications  Medication Sig Dispense Refill   acetaminophen (TYLENOL) 650 MG CR tablet Take 650 mg by mouth every 8 (eight) hours as needed for pain.     apixaban (ELIQUIS) 5 MG TABS tablet Take 1 tablet (5 mg total) by mouth 2 (two) times daily. Start taking after completion of starter pack. 60 tablet 5   ascorbic acid (VITAMIN C) 500 MG tablet Take 500 mg by mouth daily.     calcium carbonate (OS-CAL - DOSED IN MG OF ELEMENTAL CALCIUM) 1250 (500 Ca) MG tablet Take 1 tablet by mouth daily with breakfast.     Cholecalciferol (VITAMIN D3) 50 MCG (2000 UT) CAPS Take 1 capsule by mouth daily at 6 (six) AM.     diclofenac Sodium (VOLTAREN) 1 % GEL as directed Externally As needed     dorzolamide-timolol (COSOPT) 22.3-6.8 MG/ML ophthalmic solution Place 1 drop into both eyes 2 (two) times daily.     ezetimibe (ZETIA) 10 MG tablet Take 10 mg by mouth every evening.     famotidine-calcium carbonate-magnesium hydroxide (PEPCID COMPLETE) 10-800-165 MG chewable tablet Chew 1 tablet by mouth daily as needed  (indigestion).     LUMIGAN 0.01 % SOLN Place 1 drop into both eyes daily. At bedtime     Multiple Vitamin (MULTIVITAMIN WITH MINERALS) TABS tablet Take 1 tablet by mouth daily.     Omega-3 Fatty Acids (FISH OIL) 1200 MG CAPS Take 1 capsule by mouth daily.     predniSONE (DELTASONE) 5 MG tablet Take 1 tablet (5 mg total) by mouth daily with breakfast. (Patient taking differently: Take 5 mg by mouth daily as needed (headache syndrom per neurology).) 90 tablet 4   predniSONE (DELTASONE) 5 MG tablet TAKE 1 TABLET(5 MG) BY MOUTH DAILY 30 tablet 1   triamcinolone cream (KENALOG) 0.5 % Apply 1 Application topically daily as needed (itching).     UNABLE TO FIND Med Name: Tumreic powder 1/4 teaspoon daily  No current facility-administered medications for this visit.    REVIEW OF SYSTEMS:   Constitutional: ( - ) fevers, ( - )  chills , ( - ) night sweats Eyes: ( - ) blurriness of vision, ( - ) double vision, ( - ) watery eyes Ears, nose, mouth, throat, and face: ( - ) mucositis, ( - ) sore throat Respiratory: ( - ) cough, ( - ) dyspnea, ( - ) wheezes Cardiovascular: ( - ) palpitation, ( - ) chest discomfort, ( - ) lower extremity swelling Gastrointestinal:  ( - ) nausea, ( - ) heartburn, ( - ) change in bowel habits Skin: ( - ) abnormal skin rashes Lymphatics: ( - ) new lymphadenopathy, ( - ) easy bruising Neurological: ( - ) numbness, ( - ) tingling, ( - ) new weaknesses Behavioral/Psych: ( - ) mood change, ( - ) new changes  All other systems were reviewed with the patient and are negative.  PHYSICAL EXAMINATION:  Vitals:   12/26/23 1356  BP: (!) 154/91  Pulse: 70  Resp: 14  Temp: 98.5 F (36.9 C)  SpO2: 100%   Filed Weights   12/26/23 1356  Weight: 187 lb 12.8 oz (85.2 kg)    GENERAL: well appearing elderly African-American male in NAD  SKIN: skin color, texture, turgor are normal, no rashes or significant lesions EYES: conjunctiva are pink and non-injected, sclera  clear LUNGS: clear to auscultation and percussion with normal breathing effort HEART: regular rate & rhythm and no murmurs and no lower extremity edema Musculoskeletal: no cyanosis of digits and no clubbing  PSYCH: alert & oriented x 3, fluent speech NEURO: no focal motor/sensory deficits  LABORATORY DATA:  I have reviewed the data as listed    Latest Ref Rng & Units 12/26/2023    2:52 PM 11/20/2023    3:08 PM 07/17/2017   10:25 AM  CBC  WBC 4.0 - 10.5 K/uL 4.8  5.1  6.8   Hemoglobin 13.0 - 17.0 g/dL 04.5  40.9  81.1   Hematocrit 39.0 - 52.0 % 44.2  43.1  44.3   Platelets 150 - 400 K/uL 215  225  243        Latest Ref Rng & Units 12/26/2023    2:52 PM 11/20/2023    3:08 PM 07/17/2017   10:25 AM  CMP  Glucose 70 - 99 mg/dL 93  914  782   BUN 8 - 23 mg/dL 11  12  10    Creatinine 0.61 - 1.24 mg/dL 9.56  2.13  0.86   Sodium 135 - 145 mmol/L 136  137  140   Potassium 3.5 - 5.1 mmol/L 4.0  4.3  4.6   Chloride 98 - 111 mmol/L 107  105  100   CO2 22 - 32 mmol/L 24  25  24    Calcium 8.9 - 10.3 mg/dL 9.3  9.1  9.9   Total Protein 6.5 - 8.1 g/dL 7.5   7.0   Total Bilirubin 0.0 - 1.2 mg/dL 0.8   0.4   Alkaline Phos 38 - 126 U/L 45   53   AST 15 - 41 U/L 17   15   ALT 0 - 44 U/L 11   19      ASSESSMENT & PLAN Todd [name] 73 y.o. 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  After review of the  labs, review of the records, and discussion with the patient the patients findings are most consistent with a likely B-cell lymphoma and new unprovoked right lower extremity DVT, though we may find that the lymphoma was causing compression and resulted in the DVT in which case would be considered provoked.  In the interim we will continue full-strength anticoagulation.  # Right Lower Extremity DVT -- Ultrasound on 11/19/2023 showed  findings consistent with acute deep vein thrombosis involving the right femoral vein, right popliteal vein, right posterior tibial veins, and right peroneal veins.  --currently on eliquis therapy 5 mg BID  -- At this time findings do appear most consistent with unprovoked, however as a workup for lymphoma we may find there has been some compression causing this finding.  We will determine if this is provoked or unprovoked based on the lymphoma workup as noted below.  # Concern For B Cell Lymphoma -- Recommend PET CT scan were to evaluate for other sites of disease, for possible excisional biopsy. 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  Orders Placed This Encounter  Procedures   NM PET Image Initial (PI) Skull Base To Thigh (F-18 FDG)    Standing Status:   Future    Expected Date:   01/02/2024    Expiration Date:   12/25/2024    If indicated for the ordered procedure, I authorize the administration of a radiopharmaceutical per Radiology protocol:   Yes    Preferred imaging location?:   [facility]    All questions were answered. The patient knows to call the clinic with any problems, questions or concerns.  A total of more than 60 minutes were spent on this encounter with face-to-face time and non-face-to-face time, including preparing to see the patient, ordering tests and/or medications, counseling the patient and coordination of care as outlined above.   Ulysees Barns, MD Department of Hematology/Oncology [facility] Phone: [phone] Pager: [phone] Email: [email or web address]  12/26/2023 4:19 PM

## 2023-12-26 ENCOUNTER — Inpatient Hospital Stay: Attending: Hematology and Oncology | Admitting: Hematology and Oncology

## 2023-12-26 ENCOUNTER — Inpatient Hospital Stay

## 2023-12-26 ENCOUNTER — Other Ambulatory Visit: Payer: Self-pay | Admitting: Hematology and Oncology

## 2023-12-26 VITALS — BP 154/91 | HR 70 | Temp 98.5°F | Resp 14 | Wt 187.8 lb

## 2023-12-26 DIAGNOSIS — I10 Essential (primary) hypertension: Secondary | ICD-10-CM | POA: Diagnosis not present

## 2023-12-26 DIAGNOSIS — Z87891 Personal history of nicotine dependence: Secondary | ICD-10-CM | POA: Insufficient documentation

## 2023-12-26 DIAGNOSIS — C8514 Unspecified B-cell lymphoma, lymph nodes of axilla and upper limb: Secondary | ICD-10-CM | POA: Insufficient documentation

## 2023-12-26 DIAGNOSIS — I82411 Acute embolism and thrombosis of right femoral vein: Secondary | ICD-10-CM | POA: Insufficient documentation

## 2023-12-26 DIAGNOSIS — R591 Generalized enlarged lymph nodes: Secondary | ICD-10-CM

## 2023-12-26 DIAGNOSIS — Z7901 Long term (current) use of anticoagulants: Secondary | ICD-10-CM | POA: Diagnosis not present

## 2023-12-26 DIAGNOSIS — Z86718 Personal history of other venous thrombosis and embolism: Secondary | ICD-10-CM

## 2023-12-26 LAB — CMP (CANCER CENTER ONLY)
ALT: 11 U/L (ref 0–44)
AST: 17 U/L (ref 15–41)
Albumin: 4.3 g/dL (ref 3.5–5.0)
Alkaline Phosphatase: 45 U/L (ref 38–126)
Anion gap: 5 (ref 5–15)
BUN: 11 mg/dL (ref 8–23)
CO2: 24 mmol/L (ref 22–32)
Calcium: 9.3 mg/dL (ref 8.9–10.3)
Chloride: 107 mmol/L (ref 98–111)
Creatinine: 1.06 mg/dL (ref 0.61–1.24)
GFR, Estimated: >60 mL/min (ref >60)
Glucose, Bld: 93 mg/dL (ref 70–99)
Potassium: 4 mmol/L (ref 3.5–5.1)
Sodium: 136 mmol/L (ref 135–145)
Total Bilirubin: 0.8 mg/dL (ref 0.0–1.2)
Total Protein: 7.5 g/dL (ref 6.5–8.1)

## 2023-12-26 LAB — CBC WITH DIFFERENTIAL (CANCER CENTER ONLY)
Abs Immature Granulocytes: 0.01 10*3/uL (ref 0.00–0.07)
Basophils Absolute: 0.1 10*3/uL (ref 0.0–0.1)
Basophils Relative: 1 %
Eosinophils Absolute: 1.3 10*3/uL — ABNORMAL HIGH (ref 0.0–0.5)
Eosinophils Relative: 26 %
HCT: 44.2 % (ref 39.0–52.0)
Hemoglobin: 14.5 g/dL (ref 13.0–17.0)
Immature Granulocytes: 0 %
Lymphocytes Relative: 26 %
Lymphs Abs: 1.2 10*3/uL (ref 0.7–4.0)
MCH: 31.9 pg (ref 26.0–34.0)
MCHC: 32.8 g/dL (ref 30.0–36.0)
MCV: 97.1 fL (ref 80.0–100.0)
Monocytes Absolute: 0.4 10*3/uL (ref 0.1–1.0)
Monocytes Relative: 8 %
Neutro Abs: 1.9 10*3/uL (ref 1.7–7.7)
Neutrophils Relative %: 39 %
Platelet Count: 215 10*3/uL (ref 150–400)
RBC: 4.55 MIL/uL (ref 4.22–5.81)
RDW: 13.2 % (ref 11.5–15.5)
WBC Count: 4.8 10*3/uL (ref 4.0–10.5)
nRBC: 0 % (ref 0.0–0.2)

## 2023-12-26 LAB — LACTATE DEHYDROGENASE: LDH: 192 U/L (ref 98–192)

## 2023-12-27 LAB — BETA-2-GLYCOPROTEIN I ABS, IGG/M/A
Beta-2 Glyco I IgG: 12 GPI IgG units (ref 0–20)
Beta-2-Glycoprotein I IgA: 48 GPI IgA units — ABNORMAL HIGH (ref 0–25)
Beta-2-Glycoprotein I IgM: <9 GPI IgM units (ref 0–32)

## 2023-12-27 LAB — SURGICAL PATHOLOGY

## 2023-12-27 NOTE — Progress Notes (Signed)
 Met with patient after clinic visit per Dr Derek Mound request. Introduced myself to patient and explained my role in his care, gave my card and asked him and his wife to call me with any questions or concerns and escorted them to the lab for his blood work. Will continue to monitor his care for any needs.

## 2023-12-28 LAB — CARDIOLIPIN ANTIBODIES, IGG, IGM, IGA
Anticardiolipin IgA: <9 U/mL (ref 0–11)
Anticardiolipin IgG: <9 GPL U/mL (ref 0–14)
Anticardiolipin IgM: <9 [MPL'U]/mL (ref 0–12)

## 2023-12-28 LAB — FLOW CYTOMETRY

## 2024-01-02 ENCOUNTER — Encounter (HOSPITAL_COMMUNITY)
Admission: RE | Admit: 2024-01-02 | Discharge: 2024-01-02 | Disposition: A | Discharge status: Home / Self Care | Source: Ambulatory Visit | Attending: Hematology and Oncology | Admitting: Hematology and Oncology

## 2024-01-02 DIAGNOSIS — R591 Generalized enlarged lymph nodes: Secondary | ICD-10-CM | POA: Diagnosis present

## 2024-01-02 DIAGNOSIS — C8514 Unspecified B-cell lymphoma, lymph nodes of axilla and upper limb: Secondary | ICD-10-CM | POA: Insufficient documentation

## 2024-01-02 LAB — GLUCOSE, CAPILLARY: Glucose-Capillary: 105 mg/dL — ABNORMAL HIGH (ref 70–99)

## 2024-01-02 MED ORDER — FLUDEOXYGLUCOSE F - 18 (FDG) INJECTION
9.4000 | Freq: Once | INTRAVENOUS | Status: AC
Start: 2024-01-02 — End: 2024-01-02
  Administered 2024-01-02: 9.4 via INTRAVENOUS

## 2024-01-09 ENCOUNTER — Other Ambulatory Visit: Payer: Self-pay

## 2024-01-09 ENCOUNTER — Other Ambulatory Visit

## 2024-01-09 ENCOUNTER — Telehealth: Payer: Self-pay | Admitting: *Deleted

## 2024-01-09 DIAGNOSIS — C8514 Unspecified B-cell lymphoma, lymph nodes of axilla and upper limb: Secondary | ICD-10-CM

## 2024-01-09 NOTE — Telephone Encounter (Signed)
 Received call back from pt. Explained/reviewed his PET Scan results with. Advised that he needs to have an excisional lymph node biopsy-removing an entire lymph node to get a confirmed diagnosis.Advised that our Hematology navigator will be contacting the surgeon's office to get an appt for him to meet with the surgeon and then have the procedure scheduled. Pt voiced understanding. Jene Minors, Heme Navigator, aware of my conversation with pt.

## 2024-01-09 NOTE — Telephone Encounter (Signed)
 TCT patient regarding  recent PET Scan results. No answer but was able to leave vm message to call back @ [phone] today to review the following scan results: Advised that his PET scan did show multiple enlarged lymph nodes, mostly under the left arm. These are suspicious for lymphoma (as was the prior needle biopsy) I would recommend that we do an excisional biopsy to remove one of the nodes to determine if there is active lymphoma.

## 2024-01-16 ENCOUNTER — Other Ambulatory Visit (HOSPITAL_COMMUNITY): Payer: Self-pay

## 2024-01-22 ENCOUNTER — Telehealth: Payer: Self-pay | Admitting: Hematology and Oncology

## 2024-01-22 NOTE — Telephone Encounter (Signed)
 Rescheduled appointments per the biopsy being scheduled at a later date. Left the patient a voicemail with the appointment changes and will be mailed a reminder.

## 2024-01-24 ENCOUNTER — Inpatient Hospital Stay

## 2024-01-24 ENCOUNTER — Inpatient Hospital Stay: Admitting: Hematology and Oncology

## 2024-01-24 ENCOUNTER — Telehealth: Payer: Self-pay | Admitting: *Deleted

## 2024-01-24 NOTE — Telephone Encounter (Signed)
 TCT patient as this nurse had gotten a message to call him. Spoke with him. He states he has canceled his excisional lymph node biopsy for 02/12/24. He states he already had one biopsy (pt had a needle biopsy which pathology recommended the excisional biopsy for more definitive /further classification). Explained the reasoning behind the need for excisional biopsy. Pt states he does not want to go through this procedure and therefore he cancelled it already. Dr. Rosaline Coma made aware. Dr. Rosaline Coma will discuss this again with pt at his next appt in May 2025

## 2024-02-07 ENCOUNTER — Encounter: Payer: Self-pay | Admitting: Family Medicine

## 2024-02-21 ENCOUNTER — Inpatient Hospital Stay: Admitting: Hematology and Oncology

## 2024-02-21 ENCOUNTER — Inpatient Hospital Stay: Attending: Hematology and Oncology

## 2024-02-21 ENCOUNTER — Other Ambulatory Visit: Payer: Self-pay | Admitting: Hematology and Oncology

## 2024-02-21 ENCOUNTER — Telehealth: Payer: Self-pay | Admitting: *Deleted

## 2024-02-21 VITALS — BP 137/91 | HR 87 | Temp 98.2°F | Wt 182.7 lb

## 2024-02-21 DIAGNOSIS — C8514 Unspecified B-cell lymphoma, lymph nodes of axilla and upper limb: Secondary | ICD-10-CM | POA: Diagnosis present

## 2024-02-21 DIAGNOSIS — Z86718 Personal history of other venous thrombosis and embolism: Secondary | ICD-10-CM | POA: Diagnosis not present

## 2024-02-21 DIAGNOSIS — R591 Generalized enlarged lymph nodes: Secondary | ICD-10-CM

## 2024-02-21 DIAGNOSIS — Z7901 Long term (current) use of anticoagulants: Secondary | ICD-10-CM | POA: Insufficient documentation

## 2024-02-21 DIAGNOSIS — Z87891 Personal history of nicotine dependence: Secondary | ICD-10-CM | POA: Insufficient documentation

## 2024-02-21 LAB — CBC WITH DIFFERENTIAL (CANCER CENTER ONLY)
Abs Immature Granulocytes: 0.01 10*3/uL (ref 0.00–0.07)
Basophils Absolute: 0.1 10*3/uL (ref 0.0–0.1)
Basophils Relative: 1 %
Eosinophils Absolute: 1 10*3/uL — ABNORMAL HIGH (ref 0.0–0.5)
Eosinophils Relative: 19 %
HCT: 41.3 % (ref 39.0–52.0)
Hemoglobin: 14 g/dL (ref 13.0–17.0)
Immature Granulocytes: 0 %
Lymphocytes Relative: 24 %
Lymphs Abs: 1.2 10*3/uL (ref 0.7–4.0)
MCH: 32 pg (ref 26.0–34.0)
MCHC: 33.9 g/dL (ref 30.0–36.0)
MCV: 94.5 fL (ref 80.0–100.0)
Monocytes Absolute: 0.5 10*3/uL (ref 0.1–1.0)
Monocytes Relative: 9 %
Neutro Abs: 2.4 10*3/uL (ref 1.7–7.7)
Neutrophils Relative %: 47 %
Platelet Count: 189 10*3/uL (ref 150–400)
RBC: 4.37 MIL/uL (ref 4.22–5.81)
RDW: 13 % (ref 11.5–15.5)
WBC Count: 5.2 10*3/uL (ref 4.0–10.5)
nRBC: 0 % (ref 0.0–0.2)

## 2024-02-21 LAB — CMP (CANCER CENTER ONLY)
ALT: 13 U/L (ref 0–44)
AST: 18 U/L (ref 15–41)
Albumin: 4.2 g/dL (ref 3.5–5.0)
Alkaline Phosphatase: 44 U/L (ref 38–126)
Anion gap: 4 — ABNORMAL LOW (ref 5–15)
BUN: 13 mg/dL (ref 8–23)
CO2: 25 mmol/L (ref 22–32)
Calcium: 9.2 mg/dL (ref 8.9–10.3)
Chloride: 107 mmol/L (ref 98–111)
Creatinine: 1.11 mg/dL (ref 0.61–1.24)
GFR, Estimated: >60 mL/min (ref >60)
Glucose, Bld: 105 mg/dL — ABNORMAL HIGH (ref 70–99)
Potassium: 4.5 mmol/L (ref 3.5–5.1)
Sodium: 136 mmol/L (ref 135–145)
Total Bilirubin: 0.7 mg/dL (ref 0.0–1.2)
Total Protein: 7.2 g/dL (ref 6.5–8.1)

## 2024-02-21 LAB — LACTATE DEHYDROGENASE: LDH: 148 U/L (ref 98–192)

## 2024-02-21 NOTE — Telephone Encounter (Signed)
 TCT [facility] to re-schedule pt's appt for excisional biopsy. Obtained an appt for 03/17/24 @8 :30 with 8:15 arrival for registration  Pt notified of appt.

## 2024-02-21 NOTE — Progress Notes (Signed)
 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  03/15/2016: Ultrasound of the right upper extremity showed occlusive thrombosis in the cephalic vein   11/19/2023: US  showed findings consistent with acute deep vein thrombosis involving the right femoral vein, right popliteal vein, right posterior tibial veins, and right peroneal veins 12/24/2023: Lymph node biopsy of the left axilla showed findings concerning for B-cell lymphoma. 12/26/2023: Establish care with Dr. Rosaline Coma.  Interval History:  Todd [name] 73 y.o. male with medical history significant for concern for a B-cell lymphoma of the left axilla who presents for a follow up visit. The patient's last visit was on 12/26/2023 at which time he established care. In the interim since the last visit the referral was placed for excisional biopsy but the patient canceled the visit and declined to pursue further intervention.  On exam today Todd [name] reports he has been well overall in the interim since her last visit.  He reports lymph node under his arms have not gotten any bigger or more painful.  He reports that he has not noticed any other lymph nodes in his body.  He reports he does have some occasional shortness of breath on exertion but otherwise no bleeding, bruising, or dark stools.  He reports that he does have some occasional bouts of dizziness.  He notes that otherwise his health has been stable with no fevers, chills, sweats.  A full 10 point ROS is otherwise negative.  Today we discussed in detail the need for excisional biopsy.  Explained to him my concern that his biopsy is most consistent with a B-cell lymphoma but in order to  determine exactly which type of lymphoma we will require a full lymph node.  I strongly encouraged him to schedule a visit with surgery when they reach out to him again (after we replaced the referral).  Patient voices understanding of our concern and reports that he will proceed with the biopsy.  MEDICAL HISTORY:  Past Medical History:  Diagnosis Date   Anxiety    Arthritis    Back pain    Fragments in vertebrae    Elevated cholesterol    Glaucoma    right worse than left   High cholesterol    Hypertension    [name] syndrome     SURGICAL HISTORY: Past Surgical History:  Procedure Laterality Date   KNEE ARTHROSCOPY Left 1984    SOCIAL HISTORY: Social History   Socioeconomic History   Marital status: Married    Spouse name: Not on file   Number of children: 4   Years of education: Bachelor's   Highest education level: Not on file  Occupational History   Occupation: Insurance claims handler [employer]  Tobacco Use   Smoking status: Former    Current packs/day: 0.00    Types: Cigarettes    Start date: 09/25/1966    Quit date: 09/25/1976    Years since quitting: 47.4   Smokeless tobacco: Never  Vaping Use   Vaping status: Never Used  Substance and Sexual Activity   Alcohol use: Yes    Comment: occ   Drug use: No   Sexual activity: Not on file  Other Topics Concern   Not on file  Social History Narrative  Lives at home with wife.   Right handed.   Caffeine use: 1 cup coffee per day.   Occasionally drinks soda and/or tea    Social Drivers of Corporate investment banker Strain: Not on file  Food Insecurity: Not on file  Transportation Needs: Not on file  Physical Activity: Not on file  Stress: Not on file  Social Connections: Not on file  Intimate Partner Violence: Not on file    FAMILY HISTORY: Family History  Problem Relation Age of Onset   Glaucoma Mother    Pancreatic cancer Mother    Glaucoma Father    Migraines Neg Hx    Headache Neg Hx     ALLERGIES:  is  allergic to multihance  [gadobenate], rosuvastatin, simvastatin, alphagan p [brimonidine tartrate], and lipitor [atorvastatin].  MEDICATIONS:  Current Outpatient Medications  Medication Sig Dispense Refill   acetaminophen (TYLENOL) 650 MG CR tablet Take 650 mg by mouth every 8 (eight) hours as needed for pain.     apixaban  (ELIQUIS ) 5 MG TABS tablet Take 1 tablet (5 mg total) by mouth 2 (two) times daily. Start taking after completion of starter pack. 60 tablet 5   ascorbic acid (VITAMIN C) 500 MG tablet Take 500 mg by mouth daily.     calcium carbonate (OS-CAL - DOSED IN MG OF ELEMENTAL CALCIUM) 1250 (500 Ca) MG tablet Take 1 tablet by mouth daily with breakfast.     Cholecalciferol (VITAMIN D3) 50 MCG (2000 UT) CAPS Take 1 capsule by mouth daily at 6 (six) AM.     diclofenac Sodium (VOLTAREN) 1 % GEL as directed Externally As needed     dorzolamide-timolol (COSOPT) 22.3-6.8 MG/ML ophthalmic solution Place 1 drop into both eyes 2 (two) times daily.     ezetimibe (ZETIA) 10 MG tablet Take 10 mg by mouth every evening.     famotidine-calcium carbonate-magnesium hydroxide (PEPCID COMPLETE) 10-800-165 MG chewable tablet Chew 1 tablet by mouth daily as needed (indigestion).     LUMIGAN 0.01 % SOLN Place 1 drop into both eyes daily. At bedtime     Multiple Vitamin (MULTIVITAMIN WITH MINERALS) TABS tablet Take 1 tablet by mouth daily.     Omega-3 Fatty Acids (FISH OIL) 1200 MG CAPS Take 1 capsule by mouth daily.     predniSONE  (DELTASONE ) 5 MG tablet Take 1 tablet (5 mg total) by mouth daily with breakfast. (Patient taking differently: Take 5 mg by mouth daily as needed (headache syndrom per neurology).) 90 tablet 4   predniSONE  (DELTASONE ) 5 MG tablet TAKE 1 TABLET(5 MG) BY MOUTH DAILY 30 tablet 1   triamcinolone cream (KENALOG) 0.5 % Apply 1 Application topically daily as needed (itching).     UNABLE TO FIND Med Name: Tumreic powder 1/4 teaspoon daily     No current facility-administered  medications for this visit.    REVIEW OF SYSTEMS:   Constitutional: ( - ) fevers, ( - )  chills , ( - ) night sweats Eyes: ( - ) blurriness of vision, ( - ) double vision, ( - ) watery eyes Ears, nose, mouth, throat, and face: ( - ) mucositis, ( - ) sore throat Respiratory: ( - ) cough, ( - ) dyspnea, ( - ) wheezes Cardiovascular: ( - ) palpitation, ( - ) chest discomfort, ( - ) lower extremity swelling Gastrointestinal:  ( - ) nausea, ( - ) heartburn, ( - ) change in bowel habits Skin: ( - ) abnormal skin rashes Lymphatics: ( - ) new  lymphadenopathy, ( - ) easy bruising Neurological: ( - ) numbness, ( - ) tingling, ( - ) new weaknesses Behavioral/Psych: ( - ) mood change, ( - ) new changes  All other systems were reviewed with the patient and are negative.  PHYSICAL EXAMINATION: Vitals:   02/21/24 1014 02/21/24 1015  BP: (!) 144/100 (!) 137/91  Pulse: 87   Temp: 98.2 F (36.8 C)   SpO2: 97%    Filed Weights   02/21/24 1014  Weight: 182 lb 11.2 oz (82.9 kg)    GENERAL: Well-appearing elderly African male, alert, no distress and comfortable SKIN: skin color, texture, turgor are normal, no rashes or significant lesions EYES: conjunctiva are pink and non-injected, sclera clear LUNGS: clear to auscultation and percussion with normal breathing effort HEART: regular rate & rhythm and no murmurs and no lower extremity edema Musculoskeletal: no cyanosis of digits and no clubbing  PSYCH: alert & oriented x 3, fluent speech NEURO: no focal motor/sensory deficits  LABORATORY DATA:  I have reviewed the data as listed    Latest Ref Rng & Units 02/21/2024    9:42 AM 12/26/2023    2:52 PM 11/20/2023    3:08 PM  CBC  WBC 4.0 - 10.5 K/uL 5.2  4.8  5.1   Hemoglobin 13.0 - 17.0 g/dL 40.1  02.7  25.3   Hematocrit 39.0 - 52.0 % 41.3  44.2  43.1   Platelets 150 - 400 K/uL 189  215  225        Latest Ref Rng & Units 02/21/2024    9:42 AM 12/26/2023    2:52 PM 11/20/2023    3:08 PM  CMP   Glucose 70 - 99 mg/dL 664  93  403   BUN 8 - 23 mg/dL 13  11  12    Creatinine 0.61 - 1.24 mg/dL 4.74  2.59  5.63   Sodium 135 - 145 mmol/L 136  136  137   Potassium 3.5 - 5.1 mmol/L 4.5  4.0  4.3   Chloride 98 - 111 mmol/L 107  107  105   CO2 22 - 32 mmol/L 25  24  25    Calcium 8.9 - 10.3 mg/dL 9.2  9.3  9.1   Total Protein 6.5 - 8.1 g/dL 7.2  7.5    Total Bilirubin 0.0 - 1.2 mg/dL 0.7  0.8    Alkaline Phos 38 - 126 U/L 44  45    AST 15 - 41 U/L 18  17    ALT 0 - 44 U/L 13  11      Lab Results  Component Value Date   MPROTEIN Not Observed 01/07/2015    RADIOGRAPHIC STUDIES: No results found.  ASSESSMENT & PLAN Todd [name] 73 y.o. male with medical history significant for concern for a B-cell lymphoma of the left axilla who presents for a follow up visit.   After review of the labs, review of the records, and discussion with the patient the patients findings are most consistent with a likely B-cell lymphoma and new unprovoked right lower extremity DVT, though we may find that the lymphoma was causing compression and resulted in the DVT in which case would be considered provoked.  In the interim we will continue full-strength anticoagulation.   # Right Lower Extremity DVT -- Ultrasound on 11/19/2023 showed findings consistent with acute deep vein thrombosis involving the right femoral vein, right popliteal vein, right posterior tibial veins, and right peroneal veins.  --currently on eliquis  therapy  5 mg BID  -- At this time findings do appear most consistent with unprovoked, however as a workup for lymphoma we may find there has been some compression causing this finding.  We will determine if this is provoked or unprovoked based on the lymphoma workup as noted below.   # Concern For B Cell Lymphoma -- PET CT scan performed on 01/02/2024 showed 4 hypermetabolic left axillary lymph nodes consistent with malignancy.  There are also 3 lymph nodes just deep to the pectoralis minor  muscle, also concerning for lymphomatous involvement. 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 --Today discussed the need for biopsy.  Strongly encouraged him to schedule when he is called by surgery(He canceled last visit). --Labs today show white blood cell 5.2, hemoglobin 14.0, MCV 94.5, platelets 189 -- Plan to return to clinic pending the excisional biopsy.  No orders of the defined types were placed in this encounter.   All questions were answered. The patient knows to call the clinic with any problems, questions or concerns.  A total of more than 30 minutes were spent on this encounter with face-to-face time and non-face-to-face time, including preparing to see the patient, ordering tests and/or medications, counseling the patient and coordination of care as outlined above.   Rogerio Clay, MD Department of Hematology/Oncology [facility] Phone: [phone] Pager: [phone] Email: [email or web address]  02/23/2024 7:08 PM

## 2024-03-07 NOTE — Progress Notes (Signed)
 Patient FU with Dr Rosaline Coma. Patient canceled consult visit with [facility] to discuss biopsy of lymph node. Patient stated to Dr Rosaline Coma that he did not want to have the biopsy.  Dt Rosaline Coma advised patient that it was necessary to establish the correct treatment plan. Patient advised to make appointment with [facility] and FU with Dr Rosaline Coma after biopsy completed.

## 2024-03-19 ENCOUNTER — Telehealth: Payer: Self-pay | Admitting: *Deleted

## 2024-03-19 NOTE — Telephone Encounter (Signed)
 TCT patient regarding his appt for tomorrow. He has not had a lymph node biopsy yet to get an exact diagnosis. No answer but was able to leave vm message for him to call back to [phone]

## 2024-03-20 ENCOUNTER — Inpatient Hospital Stay

## 2024-03-20 ENCOUNTER — Inpatient Hospital Stay: Admitting: Hematology and Oncology

## 2024-04-01 NOTE — Progress Notes (Signed)
 Dr Lafonda RN attempted to contact patient 03/19/24 concerning appointment scheduled 03/20/24. Patient did not have scheduled lymph node biopsy. Patient asked to call clinic.

## 2024-04-02 ENCOUNTER — Telehealth: Payer: Self-pay | Admitting: Hematology and Oncology

## 2024-04-02 NOTE — Telephone Encounter (Signed)
 Rescheule appointment per provider pal.  Called left VM with changes made to the upcoming appointment.

## 2024-04-26 ENCOUNTER — Other Ambulatory Visit (HOSPITAL_COMMUNITY): Payer: Self-pay

## 2024-04-28 ENCOUNTER — Other Ambulatory Visit (HOSPITAL_COMMUNITY): Payer: Self-pay

## 2024-04-28 ENCOUNTER — Other Ambulatory Visit: Payer: Self-pay | Admitting: *Deleted

## 2024-04-28 MED ORDER — APIXABAN 5 MG PO TABS: 5.0000 mg | ORAL_TABLET | Freq: Two times a day (BID) | ORAL | 5 refills | Status: AC

## 2024-04-30 NOTE — Progress Notes (Signed)
 Appointment scheduled for patient to follow up with dr Federico 07/13/24.

## 2024-05-19 ENCOUNTER — Encounter: Payer: Self-pay | Admitting: Neurology

## 2024-05-19 ENCOUNTER — Ambulatory Visit: Payer: Medicare Other | Admitting: Neurology

## 2024-05-19 VITALS — BP 127/80 | HR 78 | Ht 71.0 in | Wt 183.0 lb

## 2024-05-19 DIAGNOSIS — M5417 Radiculopathy, lumbosacral region: Secondary | ICD-10-CM | POA: Diagnosis not present

## 2024-05-19 DIAGNOSIS — H4940 Progressive external ophthalmoplegia, unspecified eye: Secondary | ICD-10-CM

## 2024-05-19 MED ORDER — PREDNISONE 10 MG PO TABS: 10.0000 mg | ORAL_TABLET | Freq: Every day | ORAL | 3 refills | Status: AC

## 2024-05-19 NOTE — Progress Notes (Signed)
 [name]    Provider:  Dr [facility] [name] Referring Provider: Loreli Kins, MD Primary Care Physician:  Todd Kins, MD  CC:  Todd [name]  05/19/2024: Patient is here for follow up.  Beginning to have the same the same pain in the left eye, hasn't been on the steroids for 3 months. No diplopia or vision changes. We can start the steroids when having symptoms, he is not eating well and has lost weight about 10 pounds in the last year, he is retired 8 years, not eating well he just doesn't think about food, he may not eat all day, encouraged him to eat a healthy diet, enough protein. He is feeling weakness in his left leg. No low back pain. He cannot use his left leg he feel like he is going to crash knee instability and spasms down the left leg from his hip to the calf area. This isnot knew, ongoing for many years, in fact had MRI umbar spine in 2016 for the same  Three case tree left hip pain extending into the   Reviewed reports below: 10/05/2023: MRI cervical spine IMPRESSION: 1. Advanced cervical disc degeneration without spinal stenosis. 2. Moderate right neural foraminal stenosis at C5-6. 3. Mild-to-moderate bilateral neural foraminal stenosis at T1-2.  MRI 10/14/2014 lumbar spine:   IMPRESSION: 1. Severe left subarticular stenosis at L5-S1 secondary to a prominent left paramedian disc protrusion. 2. Mild right foraminal narrowing at L1-2. 3. Mild left subarticular and foraminal stenosis at L2-3. 4. Moderate left subarticular and foraminal narrowing at L3-4. Mild to moderate right subarticular and foraminal narrowing at L3-4. 5. Moderate right and mild left foraminal stenosis at L4-5. 6. Mild subarticular narrowing bilaterally at L4-5 is worse on the Righ  Reviewed labs 02/21/2024: cbc unremarable, cmp slightly elevated glucose,   No other focal neurologic deficits, associated symptoms, inciting events or modifiable factors. Patient complains of symptoms per  HPI as well as the following symptoms: per hpi . Pertinent negatives and positives per HPI. 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  I reviewed his chart, he recently saw ophthalmology.  MRI of the orbits without contrast was ordered.  He has optic atrophy of the right eye.  He is having recurrent symptoms, progressive, external ophthalmoplegia and optic atrophy from note dated 04/08/2023.  He is status post right cataract surgery, atrophic right optic nerve, MRI of the orbits showed no significant abnormality within the cavernous sinus within the limitations of noncontrast MRI, chronically atrophic right optic nerve, favor sequelae of prior insults no evidence of active inflammation.  He saw Dr. Germaine at [facility] who is a neurologist, for progressive external ophthalmoplegia and long-term current use of systemic steroids,taking 5mg  prednisone  daily. 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  Dr. Germaine continued him on prednisone .    Taking vitamin D  and calcium. No problems with the prednisone  with his stomach. He takes the prednisone  as needed when he starts feeling his tolosa hunt flaring. He sees an ophthalmologist and see ophthalmologist regularly now he has glaucoma in the right eye and takes drops, she is aware of the steroids and monitors him closely. We discussed that Prednisone  and other steroids can increase intraocular pressure (IOP) in some people, which can lead to steroid-induced glaucoma.  This can happen when steroids are taken as eye drops, tablets, injections, or implants and he has discussed with her, I will add her to this note.  If he starts feeling pain and vision changes that he feels is related to his tolsa hunt, not often a few days a week or every other week. He has not  had problems with eye movements, diplopia, opthalmolplegia since last being see.    MRI orbits 04/08/2023: Todd Aleene Counts, MD - 04/08/2023  Formatting of this note might be different from the original. 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  FINDINGS: Globes: Status post right cataract surgery. Lenses: Normal appearing native lenses. Optic nerves: Atrophic right optic nerve. Intraconal fat: Normal. Extraocular muscles: Normal. Intraorbital vasculature: Normal. Lacrimal glands: Normal.  Cavernous sinus: No significant abnormalities.  Brain Parenchyma: No mass, mass effect, acute infarct or hemorrhage. Scattered periventricular predominant T2 hyperintense foci, which may represent microvascular ischemic injury. Additionally, there are prominent Virchow-Robin spaces. Intracranial flow voids: Normal. Extra-axial spaces, basal cisterns, ventricles and sulci: Normal. Cranium: Normal. Paranasal sinuses: Normal. Mastoid air cells: Normal.  IMPRESSION: 1. No significant abnormality within the cavernous sinus within the limitations of noncontrast MRI. 2. Chronically atrophic right optic nerve, favor sequela of prior insult, no evidence of active inflammation.   Patient complains of symptoms per HPI as well as the following symptoms: glaucoma . Pertinent negatives and positives per HPI. All others negative  Patient complains of symptoms per HPI as well as the following symptoms: neck pain . Pertinent negatives and positives per HPI. All others negative   02/07/2023: Left eye pain 3 months ago, intermittent, he has dry eyes using refresh, no double vision but feeling  like pressure in the eye. Stable. Just dry eye. Vision may be impaired but may need an upgrade on the glassess. Not pain but a heaviness, started 3-4 months ago hasn't worsened, no problems with color. We called his ophthalmologist and scheduled an earlier appointment as neuro exam was normal. If no other indication, we will increased his steroids to 20mg  and decrease slowly back to 5mg  a day. 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  We reviewed literature with patient in the past.  If left untreated symptoms may resolve spontaneously after about 8 weeks.  Steroids can help with the pain but unclear if it helps with the duration of ophthalmoplegia.  Little consideration has been given to alternative therapies usually because the rapid response of pain within 24 to 72 hours.  It is rare that individuals may need other immunosuppressive medications and because it is rare and not many patients need them, I have not been able to find any clinical trials for immunosuppressant therapy.  According to and NORD the [employer] of rare diseases, this is a rare disorder and usually this is treated with steroids. Discussed risks of long term steroids.   he is here today for follow up, having pressure and symptoms in the left eye again, the last time that happened was last year and feels it coming on again the pressure that  prescedes the double vision. He takes steroids 5mg  continuously and sometimes we increase the dose when having the diplopia. We have extensively tested him for causes, send him to multiple specialists, we just do not know why he continues to have eye symptoms that change from one eye to the other. He has some discomfort but no weakness, no  radicular pains, shoots up the back of his head likely occipital irritation.   Patient complains of symptoms per HPI as well as the following symptoms: neck pain . Pertinent negatives and positives per HPI. All others negative   Interval history 08/16/2021: No double vision currently. Just some pressure behind the eyes. Pressure happens every once in a while, more on the left, he had a lens implant on the right eye but it did not help the vision, no worsening vision on the left eye, no double vision. 1-2 weeks he gets some pressure behind is eyes for one hour, in the temple area, again no double vision, no vision changes, no headache, bilateral but maybe more on the left side. He saw his eye doctor about a year ago, recommended seeing his eye doctor again. He ran out of steroids, he was on 5mg  a day, he thinks being off of the steroids has csaused the pressure. If not improved with the steroids, will repeat the imaging.    MRI orbits 06/17/2021 wo contrast: IMPRESSION: Abnormal MRI scan of the orbits without contrast showing atrophy of the right optic nerve and incidental changes of chronic paranasal sinusitis.  Probably no significant change compared with previous MRI orbits from 08/02/2017 though contrast could not be administered during the current scan  MRI brain wo contrast: 06/17/2021 MRI scan of the brain without contrast showing mild changes of chronic small vessel disease which is age-appropriate and atrophy of the right optic nerve.  As compared with previous MRI from 08/02/2017 with age-related small vessel disease changes are expectedly slightly progressed.  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  Today I reviewed literature with patient.  If left untreated symptoms may resolve  spontaneously after about 8 weeks.  Steroids can help with the pain but unclear if it helps with the duration of ophthalmoplegia.  Little consideration has been given to alternative therapies usually because the rapid response of pain within 24 to 72 hours.  It is rare that individuals may need other immunosuppressive medications and because it is rare and not many patients need them, I have not been able to find any clinical trials for immunosuppressant therapy.  According to and NORD the [employer] of rare diseases, this is a rare disorder and usually this is treated with steroids. Discussed risks of long term steroids.   He has used them every once in a while he says the pain cam eon back in November/december. He can feel it coming again. So he will take 5mg  twice a week. He is following his glaucoma, is blood glucose, his BP as steroids can worsen all of this, recommended calcium and Vitamin D . Discussed taking the medication as little as possible and with severe flair and higher doses needed call the office.   Interval history; he was on 5mg  daily and symptoms returned, headache, right eye pain, diplopia. Likely again [name]. Discussed long-term effects of steroids and changing to Rituximab.    Interval  history 01/15/2017: He is taking 5mg  every other day of prednisone . He had another episode of diplopia and he increased his prednisone  to 20mg  for a week and it improved.He feels numbness on his right side of his face that is variable with tingling in his right ear. That started with the Todd [name]. We'll continue low-dose steroids 2.5mg  every other day.  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 He declines any steroid. He was not taking his steroids consistently when he had the flare. Discussed trying 2.5mg  every other day and taking it consistently.     Interval history 07/18/2016: Patient was evaluated at [facility] by neuro-ophthalmology as well as hematology oncology. There is no other cause found for his orbital inflammation, diagnosed with noninfectious orbital inflammation or [name] syndrome. Patient has been in multiple rounds of steroids. Suggest steroid sparing agents given the side effects of long-term steroid use. Rituximab, cyclophosphamide, methotrexate and CellCept or some of the agents that have been used successfully in [name] syndrome.He is on prednisone  5mg  every other day. Discussed steroid sparing agents. He declines at this time, would prefer to stay on low-dose steroids. Will try 2.5mg  every other day. Steroids can worsen glaucoma so need to follow up with ophthalmologist and let him know about the steroids and discuss wit him. Discussed side effects of long-term steroids.      MRI [facility] 04/2016:  BRAIN Calvarium/skull base: No focal marrow replacing lesion suggestive of neoplasm. Paranasal sinuses: Imaged portions clear. Brain: Previously seen ill-defined enhancing tissue within the bilateral cavernous sinuses has essentially resolved and is favored to represent response to therapy in this patient with a diagnosis of [name] syndrome. Mild asymmetric prominence of the right 3rd cranial nerve may reflect residual inflammation. Similar atrophy of the right optic nerve. No evidence of an acute abnormality. No significant white matter disease or acute ischemia. No mass effect, hemorrhage, or hydrocephalus. No abnormal enhancement to suggest neoplasm, abscess, or mass lesion. Grossly normal flow voids in the major intracranial arteries and dural venous sinuses. Additional comments: Multilevel spondylosis of the imaged cervical spine.   HPI:  Todd [name] is a 73 y.o. male here as a referral from Dr. Loreli for repeated episodes of painful ophthalmoplegia resolved with high dose steroids suspicious of Tolosa Hunt  Syndrome.    Patient is a lovely 73 year old gentleman who is here for another episode of diplopia this time with right eye ptosis as well. 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 He was originally seen by me in April 2016 for diplopia and left-sided eye pain of unknown etiology. He was having pain on the left side of the head, pain on eye movement, headache, cramping to the middle of the head without light sensitivity, no sound sensitivity, no nausea, no vomiting  He was evaluated inpatient and MRIs(MRI brain, MRI orbits, MRV) and labs(esr,crp) did not reveal etiology. 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  Further re-examination of MRI imaging of the orbits showed enhancing soft tissue mass at the left orbital apex, possibly suggesting  tolosa hunt. The symptoms resolved with a course of high-dose steroids.   Patient returned in October 2016 with similar problems in the opposite eye, the right eye. He described pain in the back of the right eye, right temple cramping pain, double vision, side by side, with both eyes open,  pain on eye movement. He had throbbing pain behind the right eye. 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 MRI of the orbits and brain now showed asymmetry of the cavernous sinuses due to enhancing soft tissue surrounding the right internal carotid artery. 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   This has occurred since the MRI dated 12/29/2014. At that time, there appeared to be similar changes involving the left cavernous sinus. Patient's symptoms again resolved with high-dose steroids. 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 Onset 10 days ago  and progressive with vision changes(diplopia).Patient cannot afford anymore imaging. He describes the same symptoms of headaches, pressure behind the eyes, pain on eye movement, blurry and double vision and new right ptosis. No predilection for time of day.    Interval history 03/14/2016: Patient has been on a tapering course of steroids since last being seen. Started at 60mg  and last dose was a week ago and now he is starting to have the headache again. Will restart at 20mg  and taper more slowly. Today the 3rd nerve palsy is reolved.    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 Formal report: 1. 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 2. No other focal lesion of the orbits to explain diplopia. 3. Mild periventricular and subcortical T2 changes are slightly advanced for age. The finding is nonspecific but can be seen in the setting of chronic microvascular ischemia, a demyelinating process such as multiple sclerosis, vasculitis, complicated migraine headaches, or as the sequelae of a prior infectious or inflammatory process. 4. No focal pathology within the occipital cortex.   MRV 01/13/2015: Normal MRV head (without).   MRI of the brain and orbits 11/08/2015:   1.    Asymmetry of the cavernous sinuses due to enhancing soft tissue surrounding the right internal carotid artery. 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   This has occurred since the MRI dated 12/29/2014. At that time, there appeared to be similar changes involving the left cavernous sinus. 2.    A few scattered T2/FLAIR hyperintense foci consistent with mild chronic microvascular ischemic change.   This appears to have progressed slightly when compared to the prior MRI.   Serum labs  unremarkable: TSH, crp, sed rate, cbc, cmp, RF, ANA, RPR, CK, AchR antibodies, NMO, Lyme, pan-anca, IFE, paraneoplastic panel, B12, folate, HIV, Ace   CSF unremarkable: cytology,IGG index, VDRL, oligoclonal bands, ace, , glucose, cell count and diff, tuberculosis, fungus, gram stain, culture.   CSF mild increased protein 62.  Review of Systems: Patient complains of symptoms per HPI as well as the following symptoms: eye pressure . Pertinent negatives and positives per HPI. All others negative    Social History   Socioeconomic History   Marital status: Married    Spouse name: Not on file   Number of children: 4   Years of education: Bachelor's   Highest education level: Not on file  Occupational History   Occupation: Barista  Tobacco Use   Smoking status: Former  Current packs/day: 0.00    Types: Cigarettes    Start date: 09/25/1966    Quit date: 09/25/1976    Years since quitting: 47.6   Smokeless tobacco: Never  Vaping Use   Vaping status: Never Used  Substance and Sexual Activity   Alcohol use: Yes    Comment: occ   Drug use: No   Sexual activity: Not on file  Other Topics Concern   Not on file  Social History Narrative   Lives at home with wife.   Right handed.   Caffeine use: 1 cup coffee per day.   Occasionally drinks soda and/or tea    Social Drivers of Corporate investment banker Strain: Not on file  Food Insecurity: Not on file  Transportation Needs: Not on file  Physical Activity: Not on file  Stress: Not on file  Social Connections: Not on file  Intimate Partner Violence: Not on file    Family History  Problem Relation Age of Onset   Glaucoma Mother    Pancreatic cancer Mother    Glaucoma Father    Migraines Neg Hx    Headache Neg Hx     Past Medical History:  Diagnosis Date   Anxiety    Arthritis    Back pain    Fragments in vertebrae    Elevated cholesterol    Glaucoma    right worse than left   High cholesterol     Hypertension    [name] syndrome     Past Surgical History:  Procedure Laterality Date   KNEE ARTHROSCOPY Left 1984    Current Outpatient Medications  Medication Sig Dispense Refill   acetaminophen (TYLENOL) 650 MG CR tablet Take 650 mg by mouth every 8 (eight) hours as needed for pain.     apixaban  (ELIQUIS ) 5 MG TABS tablet Take 1 tablet (5 mg total) by mouth 2 (two) times daily. Start taking after completion of starter pack. 60 tablet 5   ascorbic acid (VITAMIN C) 500 MG tablet Take 500 mg by mouth daily.     calcium carbonate (OS-CAL - DOSED IN MG OF ELEMENTAL CALCIUM) 1250 (500 Ca) MG tablet Take 1 tablet by mouth daily with breakfast.     Cholecalciferol (VITAMIN D3) 50 MCG (2000 UT) CAPS Take 1 capsule by mouth daily at 6 (six) AM.     diclofenac Sodium (VOLTAREN) 1 % GEL as directed Externally As needed     dorzolamide-timolol (COSOPT) 22.3-6.8 MG/ML ophthalmic solution Place 1 drop into both eyes 2 (two) times daily.     ezetimibe (ZETIA) 10 MG tablet Take 10 mg by mouth every evening.     famotidine-calcium carbonate-magnesium hydroxide (PEPCID COMPLETE) 10-800-165 MG chewable tablet Chew 1 tablet by mouth daily as needed (indigestion).     LUMIGAN 0.01 % SOLN Place 1 drop into both eyes daily. At bedtime     Multiple Vitamin (MULTIVITAMIN WITH MINERALS) TABS tablet Take 1 tablet by mouth daily.     Omega-3 Fatty Acids (FISH OIL) 1200 MG CAPS Take 1 capsule by mouth daily.     predniSONE  (DELTASONE ) 10 MG tablet Take 1 tablet (10 mg total) by mouth daily with breakfast. 90 tablet 3   triamcinolone cream (KENALOG) 0.5 % Apply 1 Application topically daily as needed (itching).     UNABLE TO FIND Med Name: Tumreic powder 1/4 teaspoon daily     No current facility-administered medications for this visit.    Allergies as of 05/19/2024 - Review Complete 05/19/2024  Allergen Reaction Noted   Multihance  [gadobenate] Nausea And Vomiting 08/02/2017   Rosuvastatin Other (See  Comments) 11/20/2023   Simvastatin Other (See Comments) 11/20/2023   Alphagan p [brimonidine tartrate] Other (See Comments) 12/29/2014   Lipitor [atorvastatin] Other (See Comments) 01/04/2015    Vitals: BP 132/82 77 standing, 134/80 63 sitting, 134/77 59 laying BP 127/80 (Cuff Size: Normal)   Pulse 78   Ht 5' 11 (1.803 m)   Wt 183 lb (83 kg)   BMI 25.52 kg/m  Last Weight:  Wt Readings from Last 1 Encounters:  05/19/24 183 lb (83 kg)   Last Height:   Ht Readings from Last 1 Encounters:  05/19/24 5' 11 (1.803 m)    Neuro: Detailed Neurologic Exam  Speech:    Speech is normal; fluent and spontaneous with normal comprehension.  Cognition:    The patient is oriented to person, place, and time;     recent and remote memory intact;     language fluent;     normal attention, concentration,     fund of knowledge Cranial Nerves: Chronic right eye glaucoma and optic nerve head atrophy, patient has diffuse vision loss in the right eye, no changes, right pupil is unreactive in 2 to 3 mm, right APD stable and chronic.  Left pupil is pinpoint and reactive, no optic nerve head edema appreciated, extraocular movements are intact.Extraocular movements are intact. Trigeminal sensation is intact and the muscles of mastication are normal. The face is symmetric. The palate elevates in the midline. Hearing intact. Voice is normal. Shoulder shrug is normal. The tongue has normal motion without fasciculations.   Coordination: nml  Gait: nml  Motor Observation:    No asymmetry, no atrophy, and no involuntary movements noted. Tone:    Normal muscle tone.    Posture:    Posture is normal. normal erect    Strength: left leg flexion weakness 4/5 otherwise strength is V/V in the upper and lower limbs.      Sensation: intact to LT     Reflex Exam:  DTR's:    Deep tendon reflexes in the upper and lower extremities are symmetrical bilaterally.   Toes:    The toes are downgoing bilaterally.    Clonus:    Clonus is absent.    Assessment/Plan:  Todd [name] is a lovely 73y.o. male here as a referral from Dr. Loreli for repeated episodes of painful ophthalmoplegia resolved with high dose steroids. Now on steroids prn. 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 Episodes in the past resolved with steroids.Review of MRI or the orbits have shown enhancing soft tissue mass at the orbital apex unilateral to symptoms, suggesting  tolosa hunt but last MRI orbits was normal (sent to [facility] for another opinion in eurology).  Extensive workup, imaging, labs, csf as above.  Patient was evaluated at [facility] by neuro-ophthalmology as well as hematology oncology and saw a [facility] neurologist as second opinion. There is no other cause found for his orbital inflammation, diagnosed with noninfectious orbital inflammation or [name] syndrome.   Tolosa Hunt: -Will refill steroids, to take prn when feeling the same symptoms, can take at 5-10mg  daily for 1-2 weeks and if symptoms persist call for instructions for a higher dose and tapering.  - Disucssed risks of long-term steroids.  -Talk to Dr. Maree about calcium and vitamin D  supplementation due to long-term steroids -Continue follow-up with ophthalmology for glaucoma  now in the left eye as well as the right, I am concerned prednisone  can make the left eye glaucoma worse and patient was instructed only to take steroids as needed and for very short periods of time 1 to 2 weeks every 3 months if needing more to contact us .  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  We  reviewed literature with patient in the past.  If left untreated symptoms may resolve spontaneously after about 8 weeks.  Steroids can help with the pain but unclear if it helps with the duration of ophthalmoplegia.  Little consideration has been given to alternative therapies usually because the rapid response of pain within 24 to 72 hours.  It is rare that individuals may need other immunosuppressive medications and because it is rare and not many patients need them, I have not been able to find any clinical trials for immunosuppressant therapy.  According to and NORD the [employer] of rare diseases, this is a rare disorder and usually this is treated with steroids. Discussed risks of long term steroids.   - Symptoms recently include Left or right eye heaviness or opthalmoplegia, which not at this time and that is when he uses steroids, intermittent, he has dry eyes using refresh, no double vision but feeling like pressure in the eye. Stable. Has dry eye. Vision may be impaired but may need an upgrade on the glassess. Not pain but a heaviness, sresolved currently, no problems with color. We called his ophthalmologist last appointment and scheduled an earlier appointment as neuro exam was normal. If no other indication and he develops worsening symptoms, we will increased his steroids to 20mg  and decrease slowly back to 5mg  a day. For now 5mg  prn.  - Dexa scan for long-term use of steroids, per Dr. Loreli   - MRI orbits last at [facility]2023 was normal,   - Today his EOMI, . He takes steroids 5mg  prn and sometimes we increase the dose when having the diplopia (see below) and follow clinically. When this happens (last time a bout a year ago) higher doses of steroids resolve the issue.   - has been referred and seen at academic centers and multiple specialties and recently [facility] neuro, no cause found  - has right-sided lens replacement about a year ago,. He states he had labs recently with Dr.  Loreli including hgba1c, we will defer, takes calcium and vitamin D .  If symptoms worsen, contact us  and recommend repeat MRI brain and orbits w/wo contrast. Also have f/u with ophthalmology and follow closely with Dr. Loreli for diabetes and counseling on calcium and vitamin D  replacement due to the steroids which I recommend and can get OTC, watch for GI upset, dark stools.    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 He declined in the past but now is reconsidering, send to [facility] for opinion who also stated we will continue the steroid and performed an MRI orbits which was negative. Follow with ophthalmology and primary care. Cont to follow with them for presnisone use (can worsen glaucoma) and for management of calcium/vitD supplementation. We will monitor and prescribe steroids and  monitor the [employer].    - Evaluated at [facility] neuro-ophthalmology and oncology/hematology, concurs with diagnosis of Todd [name], had extensive bloodwork, LPs, imaging (see records and labs) and recently at [facility] neuro all for other opinion   Cc: [facility] [address] [phone] fax [phone], Dr. Loreli   I spent 30 minutes of face-to-face and non-face-to-face time with patient on the  1. [name] syndrome   2. 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  This included previsit chart review, lab review, study review, order entry, electronic health record documentation, patient education on the different diagnostic and therapeutic options, counseling and coordination of care, risks and benefits of management, compliance, or risk factor reduction   Onetha Epp, MD  [facility] [address] [place], [place] [address]  Phone [phone] Fax [phone]

## 2024-05-19 NOTE — Patient Instructions (Signed)
 Start taking 10mg  a day and if feel symptoms are worsening can increase to 20mg  a day or we can increase further if needed  When symptomatic can restart this dose and let me know

## 2024-07-03 ENCOUNTER — Inpatient Hospital Stay: Admitting: Hematology and Oncology

## 2024-07-03 ENCOUNTER — Inpatient Hospital Stay

## 2024-08-07 ENCOUNTER — Inpatient Hospital Stay: Admitting: Hematology and Oncology

## 2024-08-07 ENCOUNTER — Inpatient Hospital Stay: Attending: Hematology and Oncology

## 2024-08-07 ENCOUNTER — Other Ambulatory Visit: Payer: Self-pay | Admitting: Hematology and Oncology

## 2024-08-07 VITALS — BP 146/91 | HR 85 | Temp 97.3°F | Resp 17 | Wt 181.3 lb

## 2024-08-07 DIAGNOSIS — C8514 Unspecified B-cell lymphoma, lymph nodes of axilla and upper limb: Secondary | ICD-10-CM

## 2024-08-07 DIAGNOSIS — Z87891 Personal history of nicotine dependence: Secondary | ICD-10-CM | POA: Insufficient documentation

## 2024-08-07 DIAGNOSIS — R591 Generalized enlarged lymph nodes: Secondary | ICD-10-CM

## 2024-08-07 DIAGNOSIS — R590 Localized enlarged lymph nodes: Secondary | ICD-10-CM | POA: Insufficient documentation

## 2024-08-07 LAB — CMP (CANCER CENTER ONLY)
ALT: 14 U/L (ref 0–44)
AST: 20 U/L (ref 15–41)
Albumin: 4.2 g/dL (ref 3.5–5.0)
Alkaline Phosphatase: 48 U/L (ref 38–126)
Anion gap: 6 (ref 5–15)
BUN: 12 mg/dL (ref 8–23)
CO2: 25 mmol/L (ref 22–32)
Calcium: 9.5 mg/dL (ref 8.9–10.3)
Chloride: 107 mmol/L (ref 98–111)
Creatinine: 1.11 mg/dL (ref 0.61–1.24)
GFR, Estimated: >60 mL/min (ref >60)
Glucose, Bld: 99 mg/dL (ref 70–99)
Potassium: 4.2 mmol/L (ref 3.5–5.1)
Sodium: 138 mmol/L (ref 135–145)
Total Bilirubin: 0.8 mg/dL (ref 0.0–1.2)
Total Protein: 7.3 g/dL (ref 6.5–8.1)

## 2024-08-07 LAB — CBC WITH DIFFERENTIAL (CANCER CENTER ONLY)
Abs Immature Granulocytes: 0 K/uL (ref 0.00–0.07)
Basophils Absolute: 0.1 K/uL (ref 0.0–0.1)
Basophils Relative: 2 %
Eosinophils Absolute: 1 K/uL — ABNORMAL HIGH (ref 0.0–0.5)
Eosinophils Relative: 24 %
HCT: 45.2 % (ref 39.0–52.0)
Hemoglobin: 15.4 g/dL (ref 13.0–17.0)
Immature Granulocytes: 0 %
Lymphocytes Relative: 27 %
Lymphs Abs: 1.1 K/uL (ref 0.7–4.0)
MCH: 32 pg (ref 26.0–34.0)
MCHC: 34.1 g/dL (ref 30.0–36.0)
MCV: 94 fL (ref 80.0–100.0)
Monocytes Absolute: 0.4 K/uL (ref 0.1–1.0)
Monocytes Relative: 9 %
Neutro Abs: 1.5 K/uL — ABNORMAL LOW (ref 1.7–7.7)
Neutrophils Relative %: 38 %
Platelet Count: 185 K/uL (ref 150–400)
RBC: 4.81 MIL/uL (ref 4.22–5.81)
RDW: 13 % (ref 11.5–15.5)
WBC Count: 3.9 K/uL — ABNORMAL LOW (ref 4.0–10.5)
nRBC: 0 % (ref 0.0–0.2)

## 2024-08-07 LAB — LACTATE DEHYDROGENASE: LDH: 158 U/L (ref 105–235)

## 2024-08-07 NOTE — Progress Notes (Signed)
 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  03/15/2016: Ultrasound of the right upper extremity showed occlusive thrombosis in the cephalic vein   11/19/2023: US  showed findings consistent with acute deep vein thrombosis involving the right femoral vein, right popliteal vein, right posterior tibial veins, and right peroneal veins 12/24/2023: Lymph node biopsy of the left axilla showed findings concerning for B-cell lymphoma. 12/26/2023: Establish care with Dr. Federico.  Interval History:  Todd [name] 73 y.o. male with medical history significant for concern for a B-cell lymphoma of the left axilla who presents for a follow up visit. The patient's last visit was on 02/21/2024 at which time he established care. In the interim since the last visit the referral was placed for excisional biopsy but the patient canceled the visit and declined to pursue further intervention.  On exam today Todd [name] reports he has been well overall in the room since our last visit.  He does seem to have more fatigue than usual and he feels like his breathing is not as good as it had been.  He reports he used to go to walk 3 miles without difficulty and now he can only walk 1.5 miles.  He reports his weight is also fluctuating and is down to 181 pounds, can be as high as 190 pounds.  He reports that he is eating okay.  He reports that he does not have any bumps or lumps concerning for lymphadenopathies other than the lymph nodes under his left arm.  He reports he is not currently in any pain.  He reports he is taking his Eliquis  5 mg twice daily as prescribed with no bleeding, bruising, or dark stools.  He reports  accustom $0 per month.  He does not have any signs or symptoms concerning for recurrent VTE such as leg pain, leg swelling, chest pain, or shortness of breath.  A full 10 point ROS is otherwise negative.  Previously we discussed in detail the need for excisional biopsy.  Explained to him my concern that his biopsy is most consistent with a B-cell lymphoma but in order to determine exactly which type of lymphoma we will require a full lymph node.  I strongly encouraged him to schedule a visit with surgery when they reach out to him again (after we replaced the referral).  Patient voices understanding of our concern but declined to have the surgical biopsy done.   MEDICAL HISTORY:  Past Medical History:  Diagnosis Date   Anxiety    Arthritis    Back pain    Fragments in vertebrae    Elevated cholesterol    Glaucoma    right worse than left   High cholesterol    Hypertension    [name] syndrome     SURGICAL HISTORY: Past Surgical History:  Procedure Laterality Date   KNEE ARTHROSCOPY Left 1984    SOCIAL HISTORY: Social History   Socioeconomic History   Marital status: Married    Spouse name: Not on file   Number of children: 4   Years of education: Bachelor's   Highest education level: Not on file  Occupational History   Occupation: Barista  Tobacco Use   Smoking status: Former  Current packs/day: 0.00    Types: Cigarettes    Start date: 09/25/1966    Quit date: 09/25/1976    Years since quitting: 47.9   Smokeless tobacco: Never  Vaping Use   Vaping status: Never Used  Substance and Sexual Activity   Alcohol use: Yes    Comment: occ   Drug use: No   Sexual activity: Not on file  Other Topics Concern   Not on file  Social History Narrative   Lives at home with wife.   Right handed.   Caffeine use: 1 cup coffee per day.   Occasionally drinks soda and/or tea    Social Drivers of Corporate Investment Banker Strain: Not on file  Food Insecurity: Not  on file  Transportation Needs: Not on file  Physical Activity: Not on file  Stress: Not on file  Social Connections: Not on file  Intimate Partner Violence: Not on file    FAMILY HISTORY: Family History  Problem Relation Age of Onset   Glaucoma Mother    Pancreatic cancer Mother    Glaucoma Father    Migraines Neg Hx    Headache Neg Hx     ALLERGIES:  is allergic to multihance  [gadobenate], rosuvastatin, simvastatin, alphagan p [brimonidine tartrate], and lipitor [atorvastatin].  MEDICATIONS:  Current Outpatient Medications  Medication Sig Dispense Refill   acetaminophen (TYLENOL) 650 MG CR tablet Take 650 mg by mouth every 8 (eight) hours as needed for pain.     apixaban  (ELIQUIS ) 5 MG TABS tablet Take 1 tablet (5 mg total) by mouth 2 (two) times daily. Start taking after completion of starter pack. 60 tablet 5   ascorbic acid (VITAMIN C) 500 MG tablet Take 500 mg by mouth daily.     calcium carbonate (OS-CAL - DOSED IN MG OF ELEMENTAL CALCIUM) 1250 (500 Ca) MG tablet Take 1 tablet by mouth daily with breakfast.     Cholecalciferol (VITAMIN D3) 50 MCG (2000 UT) CAPS Take 1 capsule by mouth daily at 6 (six) AM.     diclofenac Sodium (VOLTAREN) 1 % GEL as directed Externally As needed     dorzolamide-timolol (COSOPT) 22.3-6.8 MG/ML ophthalmic solution Place 1 drop into both eyes 2 (two) times daily.     ezetimibe (ZETIA) 10 MG tablet Take 10 mg by mouth every evening.     famotidine-calcium carbonate-magnesium hydroxide (PEPCID COMPLETE) 10-800-165 MG chewable tablet Chew 1 tablet by mouth daily as needed (indigestion).     LUMIGAN 0.01 % SOLN Place 1 drop into both eyes daily. At bedtime     Multiple Vitamin (MULTIVITAMIN WITH MINERALS) TABS tablet Take 1 tablet by mouth daily.     Omega-3 Fatty Acids (FISH OIL) 1200 MG CAPS Take 1 capsule by mouth daily.     predniSONE  (DELTASONE ) 10 MG tablet Take 1 tablet (10 mg total) by mouth daily with breakfast. 90 tablet 3   triamcinolone  cream (KENALOG) 0.5 % Apply 1 Application topically daily as needed (itching).     UNABLE TO FIND Med Name: Tumreic powder 1/4 teaspoon daily     No current facility-administered medications for this visit.    REVIEW OF SYSTEMS:   Constitutional: ( - ) fevers, ( - )  chills , ( - ) night sweats Eyes: ( - ) blurriness of vision, ( - ) double vision, ( - ) watery eyes Ears, nose, mouth, throat, and face: ( - ) mucositis, ( - ) sore throat Respiratory: ( - ) cough, ( - )  dyspnea, ( - ) wheezes Cardiovascular: ( - ) palpitation, ( - ) chest discomfort, ( - ) lower extremity swelling Gastrointestinal:  ( - ) nausea, ( - ) heartburn, ( - ) change in bowel habits Skin: ( - ) abnormal skin rashes Lymphatics: ( - ) new lymphadenopathy, ( - ) easy bruising Neurological: ( - ) numbness, ( - ) tingling, ( - ) new weaknesses Behavioral/Psych: ( - ) mood change, ( - ) new changes  All other systems were reviewed with the patient and are negative.  PHYSICAL EXAMINATION: Vitals:   08/07/24 1216 08/07/24 1217  BP: (!) 149/91 (!) 146/91  Pulse: 85   Resp: 17   Temp: (!) 97.3 F (36.3 C)   SpO2: 99%     Filed Weights   08/07/24 1216  Weight: 181 lb 4.8 oz (82.2 kg)     GENERAL: Well-appearing elderly African male, alert, no distress and comfortable SKIN: skin color, texture, turgor are normal, no rashes or significant lesions EYES: conjunctiva are pink and non-injected, sclera clear LUNGS: clear to auscultation and percussion with normal breathing effort HEART: regular rate & rhythm and no murmurs and no lower extremity edema Musculoskeletal: no cyanosis of digits and no clubbing  PSYCH: alert & oriented x 3, fluent speech NEURO: no focal motor/sensory deficits  LABORATORY DATA:  I have reviewed the data as listed    Latest Ref Rng & Units 08/07/2024   11:22 AM 02/21/2024    9:42 AM 12/26/2023    2:52 PM  CBC  WBC 4.0 - 10.5 K/uL 3.9  5.2  4.8   Hemoglobin 13.0 - 17.0 g/dL 84.5  85.9   85.4   Hematocrit 39.0 - 52.0 % 45.2  41.3  44.2   Platelets 150 - 400 K/uL 185  189  215        Latest Ref Rng & Units 08/07/2024   11:22 AM 02/21/2024    9:42 AM 12/26/2023    2:52 PM  CMP  Glucose 70 - 99 mg/dL 99  894  93   BUN 8 - 23 mg/dL 12  13  11    Creatinine 0.61 - 1.24 mg/dL 8.88  8.88  8.93   Sodium 135 - 145 mmol/L 138  136  136   Potassium 3.5 - 5.1 mmol/L 4.2  4.5  4.0   Chloride 98 - 111 mmol/L 107  107  107   CO2 22 - 32 mmol/L 25  25  24    Calcium 8.9 - 10.3 mg/dL 9.5  9.2  9.3   Total Protein 6.5 - 8.1 g/dL 7.3  7.2  7.5   Total Bilirubin 0.0 - 1.2 mg/dL 0.8  0.7  0.8   Alkaline Phos 38 - 126 U/L 48  44  45   AST 15 - 41 U/L 20  18  17    ALT 0 - 44 U/L 14  13  11      Lab Results  Component Value Date   MPROTEIN Not Observed 01/07/2015    RADIOGRAPHIC STUDIES: No results found.  ASSESSMENT & PLAN Todd [name] 73 y.o. male with medical history significant for concern for a B-cell lymphoma of the left axilla who presents for a follow up visit.   After review of the labs, review of the records, and discussion with the patient the patients findings are most consistent with a likely B-cell lymphoma and new unprovoked right lower extremity DVT, though we may find that the lymphoma was causing compression and  resulted in the DVT in which case would be considered provoked.  In the interim we will continue full-strength anticoagulation.   # Right Lower Extremity DVT -- Ultrasound on 11/19/2023 showed findings consistent with acute deep vein thrombosis involving the right femoral vein, right popliteal vein, right posterior tibial veins, and right peroneal veins.  --currently on eliquis  therapy 5 mg BID  -- At this time findings do appear most consistent with unprovoked --RTC q 6 months    # Concern For B Cell Lymphoma -- PET CT scan performed on 01/02/2024 showed 4 hypermetabolic left axillary lymph nodes consistent with malignancy.  There are also 3 lymph nodes  just deep to the pectoralis minor muscle, also concerning for lymphomatous involvement. 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 --Today again discussed the need for biopsy.  Strongly encouraged him to proceed with biopsy --Labs today show white blood cell 3.9, hemoglobin 15.4, MCV 94, platelets 185  -- RTC in 6 months or sooner if he opts for biopsy  No orders of the defined types were placed in this encounter.   All questions were answered. The patient knows to call the clinic with any problems, questions or concerns.  A total of more than 30 minutes were spent on this encounter with face-to-face time and non-face-to-face time, including preparing to see the patient, ordering tests and/or medications, counseling the patient and coordination of care as outlined above.   Norleen IVAR Kidney, MD Department of Hematology/Oncology [facility] Phone: [phone] Pager: [phone] Email: [email or web address]  08/10/2024 7:07 PM

## 2024-08-12 NOTE — Progress Notes (Signed)
 Per request from Dr Federico to contact [facility] to schedule lymph node biopsy. [facility] contacted and date received, office to contact patient with date, time and instructions.

## 2024-08-31 ENCOUNTER — Other Ambulatory Visit: Payer: Self-pay | Admitting: General Surgery

## 2024-09-01 ENCOUNTER — Other Ambulatory Visit: Payer: Self-pay | Admitting: General Surgery

## 2024-09-01 NOTE — Progress Notes (Signed)
 REFERRING PHYSICIAN:  Federico Rush, MD PROVIDER:  JINA CLAIR NEPHEW, MD MRN: [id number] DOB: 1951-09-06 DATE OF ENCOUNTER: 09/01/2024 Subjective    Chief Complaint: No chief complaint on file.   History of Present Illness: Todd [name] is a 73 y.o. male who is seen today as an office consultation for evaluation of No chief complaint on file.   Pt was referred by Dr. Federico for assistance with evaluation of left axillary lymphadenopathy.  He had fatigue and shortness of breath with exertion.  He also complained of left axillary lymphadenopathy.  He had core needle biopsy that was positive for lymphoma, but was not adequate for full evaluation.  The patient was referred at that point for a lymph node biopsy, but canceled his appointment at that time.  Upon follow-up with Dr. Federico, he has had increased fatigue and shortness of breath.  Dr. Federico stressed importance of getting full information to provide treatment.  History of Present Illness He experiences soreness in the lymph node located in his left groin and has noticed enlarged lymph nodes in his armpits, which are the primary focus for the biopsy due to his accessibility for surgical removal. A previous core needle biopsy suggested lymphoma, but further tissue is needed for a definitive diagnosis.  He describes experiencing fatigue and shortness of breath, which have been ongoing. He is concerned about the potential side effects of his current medication, Eliquis , on his breathing. He has been on Eliquis  since February for a blood clot located on his right side.  He reports experiencing 'shock wave' sensations down his left leg, described as tingling or burning, akin to electricity. He also mentions occasional numbness in his hands, which he attributes to a previous diagnosis of arthritis, as noted in a past MRI. [facility]RA  PET 12/2023 IMPRESSION: Four hypermetabolic left axillary lymph nodes consistent with known history of  malignancy. There are 3 lymph nodes just deep to this, deep to the pectoralis minor muscle which are also likely pathologic but have less intensity of uptake thin the axillary nodes.   No other areas of abnormal radiotracer uptake along nodes above or below the diaphragm. The spleen is normal in size and has normal uptake as well.    Review of Systems: A complete review of systems was obtained from the patient.  I have reviewed this information and discussed as appropriate with the patient.  See HPI as well for other ROS.  Review of Systems  All other systems reviewed and are negative.    Medical History: Past Medical History:  Diagnosis Date  . Glaucoma (increased eye pressure)     Patient Active Problem List  Diagnosis  . Left temporal headache  . Lymphoma of lymph nodes of axilla (CMS/HHS-HCC)    Past Surgical History:  Procedure Laterality Date  . Knee Surgery  2003     Allergies  Allergen Reactions  . Gadobenate Dimeglumine  Nausea And Vomiting    Excessive vomiting after injection on 2 mri studies..  . Brimonidine Other (See Comments)    Makes eye red Makes eye red     Current Outpatient Medications on File Prior to Visit  Medication Sig Dispense Refill  . dorzolamide-timoloL (COSOPT) 22.3-6.8 mg/mL ophthalmic solution Apply 1 drop to eye 2 (two) times daily    . LUMIGAN 0.01 % ophthalmic solution      No current facility-administered medications on file prior to visit.    Family History  Problem Relation Age of Onset  . Hyperlipidemia (Elevated  cholesterol) Mother   . High blood pressure (Hypertension) Mother   . High blood pressure (Hypertension) Father   . Hyperlipidemia (Elevated cholesterol) Father      Social History   Tobacco Use  Smoking Status Former  . Current packs/day: 0.00  . Types: Cigarettes  . Quit date: 38  . Years since quitting: 36.9  Smokeless Tobacco Never     Social History   Socioeconomic History  . Marital status:  Married  Tobacco Use  . Smoking status: Former    Current packs/day: 0.00    Types: Cigarettes    Quit date: 1989    Years since quitting: 36.9  . Smokeless tobacco: Never  Substance and Sexual Activity  . Alcohol use: Yes    Comment: Not Frequent  . Drug use: Never    Objective:  There were no vitals filed for this visit.  There is no height or weight on file to calculate BMI.   Head: Normocephalic and atraumatic.  Eyes:  Conjunctivae are normal. Pupils are equal, round, and reactive to light. No scleral icterus.  Neck:  Normal range of motion. Neck supple. No tracheal deviation present. No thyromegaly present.  Resp: No respiratory distress, normal effort. Lymph: + left axillary adenopathy.  + smaller left inguinal adenopathy. No cervical or supraclavicular adenopathy. Abd:  Abdomen is soft, non distended and non tender. No masses are palpable.  There is no rebound and no guarding.  Neurological: Alert and oriented to person, place, and time. Coordination normal.  Skin: Skin is warm and dry. No rash noted. No diaphoretic. No erythema. No pallor.  Psychiatric: Normal mood and affect. Normal behavior. Judgment and thought content normal.  .   Labs, Imaging and Diagnostic Testing: 08/07/24 CBC with WBCs 3.9k and neutrophil count 1.5k CMET normal.   Assessment and Plan:     Diagnoses and all orders for this visit:  Lymphoma of lymph nodes of axilla, unspecified lymphoma type (CMS/HHS-HCC)     Assessment & Plan Excisional lymph node biopsy for suspected lymphoma of axillary lymph nodes Suspected lymphoma with core needle biopsy suggesting lymphoma. Excisional biopsy needed for definitive diagnosis. Differential includes various lymphoma types requiring different treatments. Risks include arm swelling, infection, seroma, bleeding, and sensory nerve damage. Benefits include definitive diagnosis to guide treatment. Surgery delayed until after Christmas per shared  decision-making. - Perform excisional lymph node biopsy under general anesthesia. - Send excised lymph nodes for pathology to determine lymphoma type. - Advised no strenuous activity for one week post-surgery to reduce risk of complications.     JINA CLAIR NEPHEW, MD

## 2024-09-03 ENCOUNTER — Encounter: Payer: Self-pay | Admitting: *Deleted

## 2024-10-22 ENCOUNTER — Telehealth: Payer: Self-pay | Admitting: *Deleted

## 2024-10-22 NOTE — Telephone Encounter (Signed)
 Received vm message from pt requesting a call back. TCT patient and spoke with him. He states he is having a lymph node biopsy done on 11/21/24. He is asking how many days should he hold his Eliquis  prior to the biopsy. Advised that Dr. Federico would have him hold the Eliquis  for 2 days prior to the biopsy. He can resume it the evening after the biopsy as long as there is no bleeding at biopsy site. Pt voiced understanding.

## 2024-10-22 NOTE — Telephone Encounter (Signed)
 Noted that according to pt's chart, he is having excisional biopsy on 10/29/24, not 11/21/24  TCT back to patient to confirm the dates. Spoke with him again and he states it has been moved to 11/21/24

## 2024-10-27 NOTE — Pre-Procedure Instructions (Signed)
 Surgical Instructions   Your procedure is scheduled on October 29, 2024. Report to [facility] Main Entrance A at 0900 A.M., then check in with the Admitting office. Any questions or running late day of surgery: call [phone]  Questions prior to your surgery date: call [phone], Monday-Friday, 8am-4pm. If you experience any cold or flu symptoms such as cough, fever, chills, shortness of breath, etc. between now and your scheduled surgery, please notify us  at the above number.     Remember:  Do not eat after midnight the night before your surgery   You may drink clear liquids until 0800 the morning of your surgery.   Clear liquids allowed are: Water, Non-Citrus Juices (without pulp), Carbonated Beverages, Clear Tea (no milk, honey, etc.), Black Coffee Only (NO MILK, CREAM OR POWDERED CREAMER of any kind), and Gatorade.    Take these medicines the morning of surgery with A SIP OF WATER: dorzolamide-timolol (COSOPT) 22.3-6.8 MG/ML ophthalmic solution  predniSONE  (DELTASONE )   May take these medicines IF NEEDED: acetaminophen (TYLENOL)   Follow your surgeon's instructions on stopping Eliquis . If no instructions were given, please contact your surgeon's office.   One week prior to surgery, STOP taking any Aspirin (unless otherwise instructed by your surgeon) Aleve, Naproxen, Ibuprofen, Motrin, Advil, Goody's, BC's, all herbal medications, fish oil, and non-prescription vitamins, including diclofenac Sodium (VOLTAREN) 1 % GEL                      Do NOT Smoke (Tobacco/Vaping) for 24 hours prior to your procedure.  If you use a CPAP at night, you may bring your mask/headgear for your overnight stay.   You will be asked to remove any contacts, glasses, piercing's, hearing aid's, dentures/partials prior to surgery. Please bring cases for these items if needed.    Your surgeon will determine if you are to be admitted or discharged the same day.  Patients discharged the day of  surgery will not be allowed to drive home, and someone needs to stay with them for 24 hours.  SURGICAL WAITING ROOM VISITATION Patients may have no more than 2 support people in the waiting area - these visitors may rotate.   Pre-op nurse will coordinate an appropriate time for 2 ADULT support persons, who may not rotate, to accompany patient in pre-op.  Children under the age of 21 must have an adult with them who is not the patient and must remain in the main waiting area with an adult.  If the patient needs to stay at the hospital during part of their recovery, the visitor guidelines for inpatient rooms apply.  Please refer to the [facility] website for the visitor guidelines for any additional information.   If you received a COVID test during your pre-op visit  it is requested that you wear a mask when out in public, stay away from anyone that may not be feeling well and notify your surgeon if you develop symptoms. If you have been in contact with anyone that has tested positive in the last 10 days please notify you surgeon.      Pre-operative CHG Bathing Instructions   You can play a key role in reducing the risk of infection after surgery. Your skin needs to be as free of germs as possible. You can reduce the number of germs on your skin by washing with CHG (chlorhexidine gluconate) soap before surgery. CHG is an antiseptic soap that kills germs and continues to kill germs even after  washing.   DO NOT use if you have an allergy to chlorhexidine/CHG or antibacterial soaps. If your skin becomes reddened or irritated, stop using the CHG and notify one of our RNs at [phone].              TAKE A SHOWER THE NIGHT BEFORE SURGERY   Please keep in mind the following:  DO NOT shave, including legs and underarms, 48 hours prior to surgery.   You may shave your face before/day of surgery.  Place clean sheets on your bed the night before surgery Use a clean washcloth (not used since  being washed) for shower. DO NOT sleep with pet's night before surgery.  CHG Shower Instructions:  Wash your face and private area with normal soap. If you choose to wash your hair, wash first with your normal shampoo.  After you use shampoo/soap, rinse your hair and body thoroughly to remove shampoo/soap residue.  Turn the water OFF and apply half the bottle of CHG soap to a CLEAN washcloth.  Apply CHG soap ONLY FROM YOUR NECK DOWN TO YOUR TOES (washing for 3-5 minutes)  DO NOT use CHG soap on face, private areas, open wounds, or sores.  Pay special attention to the area where your surgery is being performed.  If you are having back surgery, having someone wash your back for you may be helpful. Wait 2 minutes after CHG soap is applied, then you may rinse off the CHG soap.  Pat dry with a clean towel  Put on clean pajamas    Additional instructions for the day of surgery: If you choose, you may shower the morning of surgery with an antibacterial soap.  DO NOT APPLY any lotions, deodorants, cologne, or perfumes.   Do not wear jewelry or makeup Do not wear nail polish, gel polish, artificial nails, or any other type of covering on natural nails (fingers and toes) Do not bring valuables to the hospital. [facility] is not responsible for valuables/personal belongings. Put on clean/comfortable clothes.  Please brush your teeth.  Ask your nurse before applying any prescription medications to the skin.

## 2024-10-28 ENCOUNTER — Inpatient Hospital Stay (HOSPITAL_COMMUNITY): Admission: RE | Admit: 2024-10-28 | Source: Ambulatory Visit

## 2024-10-28 NOTE — Progress Notes (Signed)
 Dr. Dia office called and stated that the patient was canceling surgery and they would have it removed from the OR schedule. PAT appointment was canceled.

## 2024-10-29 ENCOUNTER — Ambulatory Visit (HOSPITAL_COMMUNITY): Admission: RE | Admit: 2024-10-29 | Admitting: General Surgery

## 2024-10-29 ENCOUNTER — Encounter (HOSPITAL_COMMUNITY): Admission: RE | Payer: Self-pay | Source: Home / Self Care

## 2025-02-05 ENCOUNTER — Inpatient Hospital Stay: Admitting: Hematology and Oncology

## 2025-02-05 ENCOUNTER — Inpatient Hospital Stay

## 2025-05-19 ENCOUNTER — Telehealth: Admitting: Family Medicine
